# Patient Record
Sex: Female | Born: 1985 | Race: Black or African American | Hispanic: No | Marital: Single | Smoking: Never smoker
Health system: Southern US, Community
[De-identification: ages and names within clinical notes are randomized; demographics above are authoritative.]

## PROBLEM LIST (undated history)

## (undated) DIAGNOSIS — K810 Acute cholecystitis: Secondary | ICD-10-CM

## (undated) DIAGNOSIS — D649 Anemia, unspecified: Secondary | ICD-10-CM

## (undated) DIAGNOSIS — L409 Psoriasis, unspecified: Secondary | ICD-10-CM

## (undated) HISTORY — PX: NO PAST SURGERIES: SHX2092

## (undated) HISTORY — DX: Anemia, unspecified: D64.9

---
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
## 2014-06-21 ENCOUNTER — Ambulatory Visit: Payer: Self-pay

## 2014-07-04 ENCOUNTER — Ambulatory Visit: Payer: Self-pay

## 2018-12-17 ENCOUNTER — Inpatient Hospital Stay: Payer: BC Managed Care – PPO

## 2018-12-17 ENCOUNTER — Emergency Department: Payer: BC Managed Care – PPO

## 2018-12-17 ENCOUNTER — Other Ambulatory Visit: Payer: Self-pay

## 2018-12-17 ENCOUNTER — Observation Stay
Admission: EM | Admit: 2018-12-17 | Discharge: 2018-12-19 | DRG: 418 | Disposition: A | Discharge status: Home / Self Care | Payer: BC Managed Care – PPO | Attending: General Surgery | Admitting: General Surgery

## 2018-12-17 ENCOUNTER — Encounter: Payer: Self-pay | Admitting: Emergency Medicine

## 2018-12-17 DIAGNOSIS — K810 Acute cholecystitis: Secondary | ICD-10-CM | POA: Diagnosis present

## 2018-12-17 DIAGNOSIS — E871 Hypo-osmolality and hyponatremia: Secondary | ICD-10-CM | POA: Diagnosis present

## 2018-12-17 DIAGNOSIS — R7301 Impaired fasting glucose: Secondary | ICD-10-CM | POA: Diagnosis not present

## 2018-12-17 DIAGNOSIS — E669 Obesity, unspecified: Secondary | ICD-10-CM | POA: Diagnosis present

## 2018-12-17 DIAGNOSIS — K8000 Calculus of gallbladder with acute cholecystitis without obstruction: Secondary | ICD-10-CM | POA: Diagnosis not present

## 2018-12-17 DIAGNOSIS — Z6834 Body mass index (BMI) 34.0-34.9, adult: Secondary | ICD-10-CM

## 2018-12-17 DIAGNOSIS — R1011 Right upper quadrant pain: Secondary | ICD-10-CM | POA: Diagnosis present

## 2018-12-17 HISTORY — DX: Psoriasis, unspecified: L40.9

## 2018-12-17 HISTORY — DX: Acute cholecystitis: K81.0

## 2018-12-17 LAB — CBC WITH DIFFERENTIAL/PLATELET
ABS IMMATURE GRANULOCYTES: 0.01 10*3/uL (ref 0.00–0.07)
Basophils Absolute: 0 10*3/uL (ref 0.0–0.1)
Basophils Relative: 0 %
Eosinophils Absolute: 0 10*3/uL (ref 0.0–0.5)
Eosinophils Relative: 0 %
HCT: 39.6 % (ref 36.0–46.0)
Hemoglobin: 12.7 g/dL (ref 12.0–15.0)
Immature Granulocytes: 0 %
Lymphocytes Relative: 15 %
Lymphs Abs: 1.2 10*3/uL (ref 0.7–4.0)
MCH: 27 pg (ref 26.0–34.0)
MCHC: 32.1 g/dL (ref 30.0–36.0)
MCV: 84.3 fL (ref 80.0–100.0)
MONO ABS: 0.2 10*3/uL (ref 0.1–1.0)
Monocytes Relative: 2 %
NEUTROS ABS: 7 10*3/uL (ref 1.7–7.7)
Neutrophils Relative %: 83 %
Platelets: 331 10*3/uL (ref 150–400)
RBC: 4.7 MIL/uL (ref 3.87–5.11)
RDW: 14.2 % (ref 11.5–15.5)
WBC: 8.4 10*3/uL (ref 4.0–10.5)
nRBC: 0 % (ref 0.0–0.2)

## 2018-12-17 LAB — COMPREHENSIVE METABOLIC PANEL
ALT: 25 U/L (ref 0–44)
AST: 26 U/L (ref 15–41)
Albumin: 4.1 g/dL (ref 3.5–5.0)
Alkaline Phosphatase: 65 U/L (ref 38–126)
Anion gap: 8 (ref 5–15)
BUN: 10 mg/dL (ref 6–20)
CO2: 21 mmol/L — ABNORMAL LOW (ref 22–32)
Calcium: 8.7 mg/dL — ABNORMAL LOW (ref 8.9–10.3)
Chloride: 103 mmol/L (ref 98–111)
Creatinine, Ser: 0.86 mg/dL (ref 0.44–1.00)
GFR calc non Af Amer: >60 mL/min (ref >60)
Glucose, Bld: 136 mg/dL — ABNORMAL HIGH (ref 70–99)
Potassium: 4.1 mmol/L (ref 3.5–5.1)
Sodium: 132 mmol/L — ABNORMAL LOW (ref 135–145)
Total Bilirubin: 0.4 mg/dL (ref 0.3–1.2)
Total Protein: 8.1 g/dL (ref 6.5–8.1)

## 2018-12-17 LAB — URINALYSIS, COMPLETE (UACMP) WITH MICROSCOPIC
Bilirubin Urine: NEGATIVE
Glucose, UA: NEGATIVE mg/dL
Hgb urine dipstick: NEGATIVE
Ketones, ur: 5 mg/dL — AB
Leukocytes, UA: NEGATIVE
Nitrite: NEGATIVE
Protein, ur: NEGATIVE mg/dL
Specific Gravity, Urine: 1.016 (ref 1.005–1.030)
pH: 9 — ABNORMAL HIGH (ref 5.0–8.0)

## 2018-12-17 LAB — POCT PREGNANCY, URINE: Preg Test, Ur: NEGATIVE

## 2018-12-17 LAB — I-STAT BETA HCG BLOOD, ED (NOT ORDERABLE): I-stat hCG, quantitative: <5 m[IU]/mL (ref <5)

## 2018-12-17 LAB — LIPASE, BLOOD: LIPASE: 27 U/L (ref 11–51)

## 2018-12-17 MED ORDER — ACETAMINOPHEN 650 MG RE SUPP: 650.0000 mg | Freq: Four times a day (QID) | RECTAL | Status: DC | PRN

## 2018-12-17 MED ORDER — ONDANSETRON HCL 4 MG PO TABS: 4.0000 mg | ORAL_TABLET | Freq: Four times a day (QID) | ORAL | Status: DC | PRN

## 2018-12-17 MED ORDER — ONDANSETRON HCL 4 MG/2ML IJ SOLN
4.0000 mg | Freq: Once | INTRAMUSCULAR | Status: AC
  Administered 2018-12-17: 4 mg via INTRAVENOUS
  Filled 2018-12-17: qty 2

## 2018-12-17 MED ORDER — SODIUM CHLORIDE 0.9 % IV BOLUS
1000.0000 mL | Freq: Once | INTRAVENOUS | Status: AC
  Administered 2018-12-17: 1000 mL via INTRAVENOUS

## 2018-12-17 MED ORDER — ONDANSETRON HCL 4 MG/2ML IJ SOLN
4.0000 mg | Freq: Four times a day (QID) | INTRAMUSCULAR | Status: DC | PRN
  Administered 2018-12-17: 4 mg via INTRAVENOUS
  Filled 2018-12-17: qty 2

## 2018-12-17 MED ORDER — LEVONORGEST-ETH ESTRAD 91-DAY 0.15-0.03 MG PO TABS
1.0000 | ORAL_TABLET | Freq: Every day | ORAL | Status: DC
  Administered 2018-12-18: 1 via ORAL
  Filled 2018-12-17: qty 1

## 2018-12-17 MED ORDER — HYDROMORPHONE HCL 1 MG/ML IJ SOLN
1.0000 mg | Freq: Once | INTRAMUSCULAR | Status: AC
  Administered 2018-12-17: 1 mg via INTRAVENOUS
  Filled 2018-12-17: qty 1

## 2018-12-17 MED ORDER — FENTANYL CITRATE (PF) 100 MCG/2ML IJ SOLN
50.0000 ug | Freq: Once | INTRAMUSCULAR | Status: AC
  Administered 2018-12-17: 50 ug via INTRAVENOUS
  Filled 2018-12-17: qty 2

## 2018-12-17 MED ORDER — SODIUM CHLORIDE 0.9 % IV SOLN
INTRAVENOUS | Status: DC
  Administered 2018-12-17 – 2018-12-19 (×3): via INTRAVENOUS

## 2018-12-17 MED ORDER — OXYCODONE HCL 5 MG PO TABS: 5.0000 mg | ORAL_TABLET | ORAL | Status: DC | PRN

## 2018-12-17 MED ORDER — GADOBUTROL 1 MMOL/ML IV SOLN
7.0000 mL | Freq: Once | INTRAVENOUS | Status: AC | PRN
  Administered 2018-12-17: 7 mL via INTRAVENOUS

## 2018-12-17 MED ORDER — LORAZEPAM 2 MG/ML IJ SOLN: 0.5000 mg | Freq: Once | INTRAMUSCULAR | Status: DC | PRN

## 2018-12-17 MED ORDER — PIPERACILLIN-TAZOBACTAM 3.375 G IVPB
3.3750 g | Freq: Three times a day (TID) | INTRAVENOUS | Status: DC
  Administered 2018-12-17 – 2018-12-19 (×6): 3.375 g via INTRAVENOUS
  Filled 2018-12-17 (×9): qty 50

## 2018-12-17 MED ORDER — MORPHINE SULFATE (PF) 2 MG/ML IV SOLN
2.0000 mg | INTRAVENOUS | Status: DC | PRN
  Administered 2018-12-17 (×2): 2 mg via INTRAVENOUS
  Filled 2018-12-17 (×2): qty 1

## 2018-12-17 MED ORDER — KETOROLAC TROMETHAMINE 30 MG/ML IJ SOLN
30.0000 mg | Freq: Four times a day (QID) | INTRAMUSCULAR | Status: DC
  Administered 2018-12-17 – 2018-12-19 (×6): 30 mg via INTRAVENOUS
  Filled 2018-12-17 (×6): qty 1

## 2018-12-17 MED ORDER — HYDROMORPHONE HCL 1 MG/ML IJ SOLN
0.5000 mg | INTRAMUSCULAR | Status: DC | PRN
  Administered 2018-12-17 – 2018-12-18 (×4): 0.5 mg via INTRAVENOUS
  Filled 2018-12-17 (×5): qty 1

## 2018-12-17 MED ORDER — ACETAMINOPHEN 325 MG PO TABS: 650.0000 mg | ORAL_TABLET | Freq: Four times a day (QID) | ORAL | Status: DC | PRN

## 2018-12-17 NOTE — H&P (Addendum)
[facility] [place] [facility]   PATIENT NAME: Holly PoseyCandace Fisher    MR#:  [id number]  DATE OF BIRTH:  07/12/1986  DATE OF ADMISSION:  12/17/2018  PRIMARY CARE PHYSICIAN: Juliann ParesPowell, Michele, DO   REQUESTING/REFERRING PHYSICIAN: Dr Ileana RoupJames McShane  CHIEF COMPLAINT:   Chief Complaint  Patient presents with  . Abdominal Pain  . Emesis    HISTORY OF PRESENT ILLNESS:  Holly MastersCandace Fisher  is a 32 y.o. female presents with 2 days of abdominal pain in the right upper quadrant.  She feels like a severe kicking type pain 10 out of 10 in intensity.  The pain is constant.  Worse after vomiting and palpating.  Better after IV pain medication in the emergency room.  Patient denies any fever.  Having some chills and sweats.  Patient states that she is vomited 5 times and also feels nauseous.  No blood in the vomit.  No diarrhea.  In the ER, she was found to have a stone in the gallbladder neck and increased size of the common bile duct.  Hospitalist services were contacted for further evaluation.  PAST MEDICAL HISTORY:   Past Medical History:  Diagnosis Date  . Psoriasis     PAST SURGICAL HISTORY:   Past Surgical History:  Procedure Laterality Date  . NO PAST SURGERIES      SOCIAL HISTORY:   Social History   Tobacco Use  . Smoking status: Never Smoker  . Smokeless tobacco: Never Used  Substance Use Topics  . Alcohol use: Not Currently    FAMILY HISTORY:   Family History  Problem Relation Age of Onset  . Autoimmune disease Mother   . Hypertension Mother   . Healthy Father     DRUG ALLERGIES:  No Known Allergies  REVIEW OF SYSTEMS:  CONSTITUTIONAL: No fever.  Positive for chills and sweats.  Positive for fatigue.  EYES: No blurred or double vision.  EARS, NOSE, AND THROAT: No tinnitus or ear pain.  Positive for runny nose and sore throat.  Positive for dysphasia to liquids and solids. RESPIRATORY: No cough, shortness of breath. CARDIOVASCULAR: No  chest pain.  GASTROINTESTINAL: Positive for nausea, vomiting and abdominal pain. No blood in bowel movements.  Slight constipation  GENITOURINARY: No dysuria, hematuria.  ENDOCRINE: No polyuria, nocturia,  HEMATOLOGY: No anemia, easy bruising or bleeding SKIN: No rash or lesion. MUSCULOSKELETAL: Some tightness of the joints NEUROLOGIC: No tingling, numbness, weakness.  PSYCHIATRY: No anxiety or depression.   MEDICATIONS AT HOME:   Prior to Admission medications   Medication Sig Start Date End Date Taking? Authorizing Provider  Adalimumab 40 MG/0.4ML PNKT Inject 40 mg into the skin. Every other week 07/05/18  Yes [provider]  levonorgestrel-ethinyl estradiol (JOLESSA) 0.15-0.03 MG tablet Take 1 tablet by mouth daily. 12/23/17  Yes [provider]      VITAL SIGNS:  Blood pressure 111/71, pulse 98, temperature 98 F (36.7 C), resp. rate 16, height 4\' 11"  (1.499 m), weight 77.1 kg, SpO2 99 %.  PHYSICAL EXAMINATION:  GENERAL:  32 y.o.-year-old patient lying in the bed with no acute distress.  EYES: Pupils equal, round, reactive to light and accommodation. No scleral icterus. Extraocular muscles intact.  HEENT: Head atraumatic, normocephalic. Oropharynx and nasopharynx clear.  NECK:  Supple, no jugular venous distention. No thyroid enlargement, no tenderness.  LUNGS: Normal breath sounds bilaterally, no wheezing, rales,rhonchi or crepitation. No use of accessory muscles of respiration.  CARDIOVASCULAR: S1, S2 normal. No murmurs, rubs, or gallops.  ABDOMEN: Soft, right upper quadrant tenderness, nondistended. Bowel sounds present. No organomegaly or mass.  EXTREMITIES: No pedal edema, cyanosis, or clubbing.  NEUROLOGIC: Cranial nerves II through XII are intact. Muscle strength 5/5 in all extremities. Sensation intact. Gait not checked.  PSYCHIATRIC: The patient is alert and oriented x 3.  SKIN: No rash, lesion, or ulcer.   LABORATORY PANEL:   CBC Recent Labs  Lab  12/17/18 0822  WBC 8.4  HGB 12.7  HCT 39.6  PLT 331   ------------------------------------------------------------------------------------------------------------------  Chemistries  Recent Labs  Lab 12/17/18 0822  NA 132*  K 4.1  CL 103  CO2 21*  GLUCOSE 136*  BUN 10  CREATININE 0.86  CALCIUM 8.7*  AST 26  ALT 25  ALKPHOS 65  BILITOT 0.4   ------------------------------------------------------------------------------------------------------------------   RADIOLOGY:  [place] Abdomen Limited Ruq  Result Date: 12/17/2018 CLINICAL DATA:  Right upper quadrant pain EXAM: ULTRASOUND ABDOMEN LIMITED RIGHT UPPER QUADRANT COMPARISON:  None. FINDINGS: Gallbladder: Multiple gallstones, including stone fixed at the gallbladder neck. The gallbladder is full but there is no wall thickening. Focal gallbladder tenderness per sonographer exam Common bile duct: Diameter: 12 mm Liver: No focal lesion identified. Within normal limits in parenchymal echogenicity. Portal vein is patent on color Doppler imaging with normal direction of blood flow towards the liver. IMPRESSION: 1. Cholelithiasis including stone fixed in the gallbladder neck. 2. Gallbladder is focally tender and may be obstructed. There is no wall thickening or edema typical of acute cholecystitis. 3. Dilated common bile duct (12 mm) without visible choledocholithiasis. Electronically Signed   By: Marnee SpringJonathon  Watts M.D.   On: 12/17/2018 09:34    IMPRESSION AND PLAN:   1.  Right upper quadrant abdominal pain with cholelithiasis and a stone fixed in the gallbladder neck.  Concerning for choledocholithiasis.  Get an MRCP.  N.p.o. except sips with medications.  Obtain gastrointestinal and surgical consultations.  Empiric antibiotic.  As needed nausea and pain medications.  IV fluid hydration. 2.  Impaired fasting glucose check a hemoglobin A1c 3.  Obesity.  BMI 34.34.  Weight loss needed 4.  Slight hyponatremia.  IV fluid hydration.  All the  records are reviewed and case discussed with ED provider. Management plans discussed with the patient, family and they are in agreement.  CODE STATUS: Full Code  TOTAL TIME TAKING CARE OF THIS PATIENT: 45 minutes.    Alford Highlandichard Abbie Berling M.D on 12/17/2018 at 10:31 AM  Between 7am to 6pm - Pager - [phone]  After 6pm call admission pager [phone]  [facility] Office  [phone]  CC: Primary care physician; Juliann ParesPowell, Michele, DO

## 2018-12-17 NOTE — Progress Notes (Signed)
Patient off floor to MRI. Wheeled in bed by transporter.

## 2018-12-17 NOTE — Consult Note (Signed)
Date of Consultation:  12/17/2018  Requesting Physician:  Alford Highlandichard Wieting, MD  Reason for Consultation:  Acute cholecystitis  History of Present Illness: Holly Fisher is a 32 y.o. female who presented this morning to the emergency room with right upper quadrant abdominal pain associated with nausea and vomiting.  The patient reports that her pain started 2 days ago and is described as severe.  She has had episodes of emesis and currently still feels nauseous.  She presents emergency room this morning.  Her work-up included laboratory studies which were were overall normal with a white blood cell count of 8.4 and normal LFTs.  Her ultrasound showed cholelithiasis with stone in the gallbladder neck without wall thickening but the gallbladder is distended.  However her common bile duct appeared to be 12 mm in size.  An MRCP was done today this revealed cholelithiasis with a trace amount of pericholecystic fluid and gallbladder distention but no choledocholithiasis.  Surgery was consulted for further evaluation and possible cholecystectomy.  Of note, the patient reports that she has never had any episodes of pain like this before and not even any discomfort after eating.  Past Medical History: Past Medical History:  Diagnosis Date  . Acute cholecystitis   . Psoriasis      Past Surgical History: Past Surgical History:  Procedure Laterality Date  . NO PAST SURGERIES      Home Medications: Prior to Admission medications   Medication Sig Start Date End Date Taking? Authorizing Provider  Adalimumab 40 MG/0.4ML PNKT Inject 40 mg into the skin. Every other week 07/05/18  Yes [provider]  levonorgestrel-ethinyl estradiol (JOLESSA) 0.15-0.03 MG tablet Take 1 tablet by mouth daily. 12/23/17  Yes [provider]    Allergies: No Known Allergies  Social History:  reports that she has never smoked. She has never used smokeless tobacco. She reports previous alcohol use. She  reports previous drug use.   Family History: Family History  Problem Relation Age of Onset  . Autoimmune disease Mother   . Hypertension Mother   . Healthy Father     Review of Systems: Review of Systems  Constitutional: Negative for chills and fever.  HENT: Negative for hearing loss.   Respiratory: Negative for shortness of breath.   Cardiovascular: Negative for chest pain.  Gastrointestinal: Positive for abdominal pain, nausea and vomiting. Negative for constipation and diarrhea.  Genitourinary: Negative for dysuria.  Musculoskeletal: Negative for myalgias.  Skin: Negative for rash.  Neurological: Negative for dizziness.  Psychiatric/Behavioral: Negative for depression.    Physical Exam BP (!) 146/77 (BP Location: Left Arm)   Pulse 84   Temp 98.7 F (37.1 C) (Oral)   Resp 20   Ht 4\' 11"  (1.499 m)   Wt 77.1 kg   SpO2 99%   BMI 34.34 kg/m  CONSTITUTIONAL: No acute distress HEENT:  Normocephalic, atraumatic, extraocular motion intact. NECK: Trachea is midline, and there is no jugular venous distension. RESPIRATORY:  Lungs are clear, and breath sounds are equal bilaterally. Normal respiratory effort without pathologic use of accessory muscles. CARDIOVASCULAR: Heart is regular without murmurs, gallops, or rubs. GI: The abdomen is soft, nondistended, with tenderness to palpation in the right upper quadrant.  Patient has a positive Murphy's sign.  MUSCULOSKELETAL:  Normal muscle strength and tone in all four extremities.  No peripheral edema or cyanosis. SKIN: Skin turgor is normal. There are no pathologic skin lesions.  NEUROLOGIC:  Motor and sensation is grossly normal.  Cranial nerves are grossly  intact. PSYCH:  Alert and oriented to person, place and time. Affect is normal.  Laboratory Analysis: Results for orders placed or performed during the hospital encounter of 12/17/18 (from the past 24 hour(s))  Urinalysis, Complete w Microscopic     Status: Abnormal    Collection Time: 12/17/18  8:22 AM  Result Value Ref Range   Color, Urine YELLOW (A) YELLOW   APPearance CLOUDY (A) CLEAR   Specific Gravity, Urine 1.016 1.005 - 1.030   pH 9.0 (H) 5.0 - 8.0   Glucose, UA NEGATIVE NEGATIVE mg/dL   Hgb urine dipstick NEGATIVE NEGATIVE   Bilirubin Urine NEGATIVE NEGATIVE   Ketones, ur 5 (A) NEGATIVE mg/dL   Protein, ur NEGATIVE NEGATIVE mg/dL   Nitrite NEGATIVE NEGATIVE   Leukocytes, UA NEGATIVE NEGATIVE   RBC / HPF 0-5 0 - 5 RBC/hpf   WBC, UA 0-5 0 - 5 WBC/hpf   Bacteria, UA RARE (A) NONE SEEN   Squamous Epithelial / LPF 0-5 0 - 5   Mucus PRESENT   CBC with Differential     Status: None   Collection Time: 12/17/18  8:22 AM  Result Value Ref Range   WBC 8.4 4.0 - 10.5 K/uL   RBC 4.70 3.87 - 5.11 MIL/uL   Hemoglobin 12.7 12.0 - 15.0 g/dL   HCT 16.1 09.6 - 04.5 %   MCV 84.3 80.0 - 100.0 fL   MCH 27.0 26.0 - 34.0 pg   MCHC 32.1 30.0 - 36.0 g/dL   RDW 40.9 81.1 - 91.4 %   Platelets 331 150 - 400 K/uL   nRBC 0.0 0.0 - 0.2 %   Neutrophils Relative % 83 %   Neutro Abs 7.0 1.7 - 7.7 K/uL   Lymphocytes Relative 15 %   Lymphs Abs 1.2 0.7 - 4.0 K/uL   Monocytes Relative 2 %   Monocytes Absolute 0.2 0.1 - 1.0 K/uL   Eosinophils Relative 0 %   Eosinophils Absolute 0.0 0.0 - 0.5 K/uL   Basophils Relative 0 %   Basophils Absolute 0.0 0.0 - 0.1 K/uL   Immature Granulocytes 0 %   Abs Immature Granulocytes 0.01 0.00 - 0.07 K/uL  Comprehensive metabolic panel     Status: Abnormal   Collection Time: 12/17/18  8:22 AM  Result Value Ref Range   Sodium 132 (L) 135 - 145 mmol/L   Potassium 4.1 3.5 - 5.1 mmol/L   Chloride 103 98 - 111 mmol/L   CO2 21 (L) 22 - 32 mmol/L   Glucose, Bld 136 (H) 70 - 99 mg/dL   BUN 10 6 - 20 mg/dL   Creatinine, Ser 7.82 0.44 - 1.00 mg/dL   Calcium 8.7 (L) 8.9 - 10.3 mg/dL   Total Protein 8.1 6.5 - 8.1 g/dL   Albumin 4.1 3.5 - 5.0 g/dL   AST 26 15 - 41 U/L   ALT 25 0 - 44 U/L   Alkaline Phosphatase 65 38 - 126 U/L   Total  Bilirubin 0.4 0.3 - 1.2 mg/dL   GFR calc non Af Amer >60 >60 mL/min   GFR calc Af Amer >60 >60 mL/min   Anion gap 8 5 - 15  Lipase, blood     Status: None   Collection Time: 12/17/18  8:22 AM  Result Value Ref Range   Lipase 27 11 - 51 U/L  I-Stat beta hCG blood, ED     Status: None   Collection Time: 12/17/18  8:26 AM  Result Value Ref Range  I-stat hCG, quantitative <5.0 <5 mIU/mL   Comment 3          Pregnancy, urine POC     Status: None   Collection Time: 12/17/18  8:31 AM  Result Value Ref Range   Preg Test, Ur NEGATIVE NEGATIVE    Imaging: Mr 3d Recon At Scanner  Result Date: 12/17/2018 CLINICAL DATA:  32 year old female with history of right upper quadrant abdominal pain. No fever or elevated white blood count. Gallstones noted on recent ultrasound examination. EXAM: MRI ABDOMEN WITHOUT AND WITH CONTRAST (INCLUDING MRCP) TECHNIQUE: Multiplanar multisequence MR imaging of the abdomen was performed both before and after the administration of intravenous contrast. Heavily T2-weighted images of the biliary and pancreatic ducts were obtained, and three-dimensional MRCP images were rendered by post processing. CONTRAST:  7 mL of Gadavist. COMPARISON:  No prior abdominal MRI. Abdominal ultrasound 12/17/2018. FINDINGS: Lower chest: Unremarkable. Hepatobiliary: In segment 4A of the liver there is a subtle 12 mm lesion which is T1 isointense, mildly T2 hyperintense and demonstrates arterial phase hyperenhancement with persistent enhancement on delayed phase images. No other suspicious hepatic lesions are noted. No intra or extrahepatic biliary ductal dilatation noted on MRCP images. Common bile duct measures 6 mm in the porta hepatis. Numerous filling defects are noted within the gallbladder, compatible with gallstones, including gallstones measuring up to 12 mm in the gallbladder neck. Gallbladder is only moderately distended. No gallbladder wall thickening. Trace amount of pericholecystic  fluid. Pancreas: No pancreatic mass. No pancreatic ductal dilatation. No pancreatic or peripancreatic fluid or inflammatory changes. Spleen:  Unremarkable. Adrenals/Urinary Tract: Bilateral kidneys and bilateral adrenal glands are normal in appearance. No hydroureteronephrosis in the visualized portions of the abdomen. Stomach/Bowel: Visualized portions are unremarkable. Vascular/Lymphatic: No significant atherosclerotic disease or definite aneurysm identified in the visualized abdominal vasculature. No lymphadenopathy noted in the abdomen. Other: No significant volume of ascites noted in the visualized portions of the abdomen. Musculoskeletal: No aggressive appearing osseous lesions are noted in the visualized portions of the skeleton. IMPRESSION: 1. Cholelithiasis with a trace amount of pericholecystic fluid. Gallbladder is only moderately distended at this time. Findings are considered equivocal for acute cholecystitis. 2. No choledocholithiasis or evidence to suggest biliary tract obstruction at this time. 3. 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 This could be definitively characterized with follow-up MRI of the abdomen with and without IV gadolinium (Eovist) if of clinical concern. Electronically Signed   By: Trudie Reed M.D.   On: 12/17/2018 15:47   Mr Abdomen Mrcp Vivien Rossetti Contast  Result Date: 12/17/2018 CLINICAL DATA:  32 year old female with history of right upper quadrant abdominal pain. No fever or elevated white blood count. Gallstones noted on recent ultrasound examination. EXAM: MRI ABDOMEN WITHOUT AND WITH CONTRAST (INCLUDING MRCP) TECHNIQUE: Multiplanar multisequence MR imaging of the abdomen was performed both before and after the administration of intravenous contrast. Heavily T2-weighted images of the biliary and pancreatic ducts were obtained,  and three-dimensional MRCP images were rendered by post processing. CONTRAST:  7 mL of Gadavist. COMPARISON:  No prior abdominal MRI. Abdominal ultrasound 12/17/2018. FINDINGS: Lower chest: Unremarkable. Hepatobiliary: In segment 4A of the liver there is a subtle 12 mm lesion which is T1 isointense, mildly T2 hyperintense and demonstrates arterial phase hyperenhancement with persistent enhancement on delayed phase images. No other suspicious hepatic lesions are noted. No intra or extrahepatic biliary ductal  dilatation noted on MRCP images. Common bile duct measures 6 mm in the porta hepatis. Numerous filling defects are noted within the gallbladder, compatible with gallstones, including gallstones measuring up to 12 mm in the gallbladder neck. Gallbladder is only moderately distended. No gallbladder wall thickening. Trace amount of pericholecystic fluid. Pancreas: No pancreatic mass. No pancreatic ductal dilatation. No pancreatic or peripancreatic fluid or inflammatory changes. Spleen:  Unremarkable. Adrenals/Urinary Tract: Bilateral kidneys and bilateral adrenal glands are normal in appearance. No hydroureteronephrosis in the visualized portions of the abdomen. Stomach/Bowel: Visualized portions are unremarkable. Vascular/Lymphatic: No significant atherosclerotic disease or definite aneurysm identified in the visualized abdominal vasculature. No lymphadenopathy noted in the abdomen. Other: No significant volume of ascites noted in the visualized portions of the abdomen. Musculoskeletal: No aggressive appearing osseous lesions are noted in the visualized portions of the skeleton. IMPRESSION: 1. Cholelithiasis with a trace amount of pericholecystic fluid. Gallbladder is only moderately distended at this time. Findings are considered equivocal for acute cholecystitis. 2. No choledocholithiasis or evidence to suggest biliary tract obstruction at this time. 3. 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 This could be definitively characterized with follow-up MRI of the abdomen with and without IV gadolinium (Eovist) if of clinical concern. Electronically Signed   By: Trudie Reed M.D.   On: 12/17/2018 15:47   [place] Abdomen Limited Ruq  Result Date: 12/17/2018 CLINICAL DATA:  Right upper quadrant pain EXAM: ULTRASOUND ABDOMEN LIMITED RIGHT UPPER QUADRANT COMPARISON:  None. FINDINGS: Gallbladder: Multiple gallstones, including stone fixed at the gallbladder neck. The gallbladder is full but there is no wall thickening. Focal gallbladder tenderness per sonographer exam Common bile duct: Diameter: 12 mm Liver: No focal lesion identified. Within normal limits in parenchymal echogenicity. Portal vein is patent on color Doppler imaging with normal direction of blood flow towards the liver. IMPRESSION: 1. Cholelithiasis including stone fixed in the gallbladder neck. 2. Gallbladder is focally tender and may be obstructed. There is no wall thickening or edema typical of acute cholecystitis. 3. Dilated common bile duct (12 mm) without visible choledocholithiasis. Electronically Signed   By: Marnee Spring M.D.   On: 12/17/2018 09:34    Assessment and Plan: This is a 32 y.o. female with acute cholecystitis.  I have independently viewed the patient's imaging studies and reviewed the patient's laboratory studies.  Overall her ultrasound does show distended gallbladder with no wall thickening and with cholelithiasis with a stone stuck at the neck of the gallbladder.  Her common bile duct does appear dilated but I do not see any filling defects.  MRCP confirms this with no choledocholithiasis but there is concern for cholecystitis given the pericholecystic fluid and gallbladder distention and positive Murphy's sign.  Discussed with the patient that given her findings my concern is  that she does have acute cholecystitis.  There is no choledocholithiasis and her LFTs are normal.  However did discuss with the patient that the standard of care for cholecystitis includes cholecystectomy.  She was started on IV antibiotics as well as IV fluid hydration.  She will be kept n.p.o. given her persistent nausea right now.  She still reports right upper quadrant pain and we will change her pain medications to include Toradol and Dilaudid to help better with pain control.  Discussed with the patient that we will plan on taking her to the operating room tomorrow morning for  laparoscopic cholecystectomy.  Unfortunately currently there is no or availability tonight.  Discussed with her the risks of bleeding, infection, injury to surrounding structures.  Discussed with her also that I would not be at the hospital tomorrow and instead Dr. Hazle Quantintron-Diaz has graciously agreed to take her to the operating room tomorrow.  Patient is in agreement with this plan and all of her questions have been answered.  Face-to-face time spent with the patient and care providers was 80 minutes, with more than 50% of the time spent counseling, educating, and coordinating care of the patient.     Howie IllJose Luis Alyviah Crandle, MD [facility] Pg:  [phone]

## 2018-12-17 NOTE — H&P (View-Only) (Signed)
Date of Consultation:  12/17/2018  Requesting Physician:  Richard Wieting, MD  Reason for Consultation:  Acute cholecystitis  History of Present Illness: Holly Fisher is a 32 y.o. female who presented this morning to the emergency room with right upper quadrant abdominal pain associated with nausea and vomiting.  The patient reports that her pain started 2 days ago and is described as severe.  She has had episodes of emesis and currently still feels nauseous.  She presents emergency room this morning.  Her work-up included laboratory studies which were were overall normal with a white blood cell count of 8.4 and normal LFTs.  Her ultrasound showed cholelithiasis with stone in the gallbladder neck without wall thickening but the gallbladder is distended.  However her common bile duct appeared to be 12 mm in size.  An MRCP was done today this revealed cholelithiasis with a trace amount of pericholecystic fluid and gallbladder distention but no choledocholithiasis.  Surgery was consulted for further evaluation and possible cholecystectomy.  Of note, the patient reports that she has never had any episodes of pain like this before and not even any discomfort after eating.  Past Medical History: Past Medical History:  Diagnosis Date  . Acute cholecystitis   . Psoriasis      Past Surgical History: Past Surgical History:  Procedure Laterality Date  . NO PAST SURGERIES      Home Medications: Prior to Admission medications   Medication Sig Start Date End Date Taking? Authorizing Provider  Adalimumab 40 MG/0.4ML PNKT Inject 40 mg into the skin. Every other week 07/05/18  Yes [provider]  levonorgestrel-ethinyl estradiol (JOLESSA) 0.15-0.03 MG tablet Take 1 tablet by mouth daily. 12/23/17  Yes [provider]    Allergies: No Known Allergies  Social History:  reports that she has never smoked. She has never used smokeless tobacco. She reports previous alcohol use. She  reports previous drug use.   Family History: Family History  Problem Relation Age of Onset  . Autoimmune disease Mother   . Hypertension Mother   . Healthy Father     Review of Systems: Review of Systems  Constitutional: Negative for chills and fever.  HENT: Negative for hearing loss.   Respiratory: Negative for shortness of breath.   Cardiovascular: Negative for chest pain.  Gastrointestinal: Positive for abdominal pain, nausea and vomiting. Negative for constipation and diarrhea.  Genitourinary: Negative for dysuria.  Musculoskeletal: Negative for myalgias.  Skin: Negative for rash.  Neurological: Negative for dizziness.  Psychiatric/Behavioral: Negative for depression.    Physical Exam BP (!) 146/77 (BP Location: Left Arm)   Pulse 84   Temp 98.7 F (37.1 C) (Oral)   Resp 20   Ht 4' 11" (1.499 m)   Wt 77.1 kg   SpO2 99%   BMI 34.34 kg/m  CONSTITUTIONAL: No acute distress HEENT:  Normocephalic, atraumatic, extraocular motion intact. NECK: Trachea is midline, and there is no jugular venous distension. RESPIRATORY:  Lungs are clear, and breath sounds are equal bilaterally. Normal respiratory effort without pathologic use of accessory muscles. CARDIOVASCULAR: Heart is regular without murmurs, gallops, or rubs. GI: The abdomen is soft, nondistended, with tenderness to palpation in the right upper quadrant.  Patient has a positive Murphy's sign.  MUSCULOSKELETAL:  Normal muscle strength and tone in all four extremities.  No peripheral edema or cyanosis. SKIN: Skin turgor is normal. There are no pathologic skin lesions.  NEUROLOGIC:  Motor and sensation is grossly normal.  Cranial nerves are grossly   intact. PSYCH:  Alert and oriented to person, place and time. Affect is normal.  Laboratory Analysis: Results for orders placed or performed during the hospital encounter of 12/17/18 (from the past 24 hour(s))  Urinalysis, Complete w Microscopic     Status: Abnormal    Collection Time: 12/17/18  8:22 AM  Result Value Ref Range   Color, Urine YELLOW (A) YELLOW   APPearance CLOUDY (A) CLEAR   Specific Gravity, Urine 1.016 1.005 - 1.030   pH 9.0 (H) 5.0 - 8.0   Glucose, UA NEGATIVE NEGATIVE mg/dL   Hgb urine dipstick NEGATIVE NEGATIVE   Bilirubin Urine NEGATIVE NEGATIVE   Ketones, ur 5 (A) NEGATIVE mg/dL   Protein, ur NEGATIVE NEGATIVE mg/dL   Nitrite NEGATIVE NEGATIVE   Leukocytes, UA NEGATIVE NEGATIVE   RBC / HPF 0-5 0 - 5 RBC/hpf   WBC, UA 0-5 0 - 5 WBC/hpf   Bacteria, UA RARE (A) NONE SEEN   Squamous Epithelial / LPF 0-5 0 - 5   Mucus PRESENT   CBC with Differential     Status: None   Collection Time: 12/17/18  8:22 AM  Result Value Ref Range   WBC 8.4 4.0 - 10.5 K/uL   RBC 4.70 3.87 - 5.11 MIL/uL   Hemoglobin 12.7 12.0 - 15.0 g/dL   HCT 39.6 36.0 - 46.0 %   MCV 84.3 80.0 - 100.0 fL   MCH 27.0 26.0 - 34.0 pg   MCHC 32.1 30.0 - 36.0 g/dL   RDW 14.2 11.5 - 15.5 %   Platelets 331 150 - 400 K/uL   nRBC 0.0 0.0 - 0.2 %   Neutrophils Relative % 83 %   Neutro Abs 7.0 1.7 - 7.7 K/uL   Lymphocytes Relative 15 %   Lymphs Abs 1.2 0.7 - 4.0 K/uL   Monocytes Relative 2 %   Monocytes Absolute 0.2 0.1 - 1.0 K/uL   Eosinophils Relative 0 %   Eosinophils Absolute 0.0 0.0 - 0.5 K/uL   Basophils Relative 0 %   Basophils Absolute 0.0 0.0 - 0.1 K/uL   Immature Granulocytes 0 %   Abs Immature Granulocytes 0.01 0.00 - 0.07 K/uL  Comprehensive metabolic panel     Status: Abnormal   Collection Time: 12/17/18  8:22 AM  Result Value Ref Range   Sodium 132 (L) 135 - 145 mmol/L   Potassium 4.1 3.5 - 5.1 mmol/L   Chloride 103 98 - 111 mmol/L   CO2 21 (L) 22 - 32 mmol/L   Glucose, Bld 136 (H) 70 - 99 mg/dL   BUN 10 6 - 20 mg/dL   Creatinine, Ser 0.86 0.44 - 1.00 mg/dL   Calcium 8.7 (L) 8.9 - 10.3 mg/dL   Total Protein 8.1 6.5 - 8.1 g/dL   Albumin 4.1 3.5 - 5.0 g/dL   AST 26 15 - 41 U/L   ALT 25 0 - 44 U/L   Alkaline Phosphatase 65 38 - 126 U/L   Total  Bilirubin 0.4 0.3 - 1.2 mg/dL   GFR calc non Af Amer >60 >60 mL/min   GFR calc Af Amer >60 >60 mL/min   Anion gap 8 5 - 15  Lipase, blood     Status: None   Collection Time: 12/17/18  8:22 AM  Result Value Ref Range   Lipase 27 11 - 51 U/L  I-Stat beta hCG blood, ED     Status: None   Collection Time: 12/17/18  8:26 AM  Result Value Ref Range     I-stat hCG, quantitative <5.0 <5 mIU/mL   Comment 3          Pregnancy, urine POC     Status: None   Collection Time: 12/17/18  8:31 AM  Result Value Ref Range   Preg Test, Ur NEGATIVE NEGATIVE    Imaging: Mr 3d Recon At Scanner  Result Date: 12/17/2018 CLINICAL DATA:  32-year-old female with history of right upper quadrant abdominal pain. No fever or elevated white blood count. Gallstones noted on recent ultrasound examination. EXAM: MRI ABDOMEN WITHOUT AND WITH CONTRAST (INCLUDING MRCP) TECHNIQUE: Multiplanar multisequence MR imaging of the abdomen was performed both before and after the administration of intravenous contrast. Heavily T2-weighted images of the biliary and pancreatic ducts were obtained, and three-dimensional MRCP images were rendered by post processing. CONTRAST:  7 mL of Gadavist. COMPARISON:  No prior abdominal MRI. Abdominal ultrasound 12/17/2018. FINDINGS: Lower chest: Unremarkable. Hepatobiliary: In segment 4A of the liver there is a subtle 12 mm lesion which is T1 isointense, mildly T2 hyperintense and demonstrates arterial phase hyperenhancement with persistent enhancement on delayed phase images. No other suspicious hepatic lesions are noted. No intra or extrahepatic biliary ductal dilatation noted on MRCP images. Common bile duct measures 6 mm in the porta hepatis. Numerous filling defects are noted within the gallbladder, compatible with gallstones, including gallstones measuring up to 12 mm in the gallbladder neck. Gallbladder is only moderately distended. No gallbladder wall thickening. Trace amount of pericholecystic  fluid. Pancreas: No pancreatic mass. No pancreatic ductal dilatation. No pancreatic or peripancreatic fluid or inflammatory changes. Spleen:  Unremarkable. Adrenals/Urinary Tract: Bilateral kidneys and bilateral adrenal glands are normal in appearance. No hydroureteronephrosis in the visualized portions of the abdomen. Stomach/Bowel: Visualized portions are unremarkable. Vascular/Lymphatic: No significant atherosclerotic disease or definite aneurysm identified in the visualized abdominal vasculature. No lymphadenopathy noted in the abdomen. Other: No significant volume of ascites noted in the visualized portions of the abdomen. Musculoskeletal: No aggressive appearing osseous lesions are noted in the visualized portions of the skeleton. IMPRESSION: 1. Cholelithiasis with a trace amount of pericholecystic fluid. Gallbladder is only moderately distended at this time. Findings are considered equivocal for acute cholecystitis. 2. No choledocholithiasis or evidence to suggest biliary tract obstruction at this time. 3. 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 This could be definitively characterized with follow-up MRI of the abdomen with and without IV gadolinium (Eovist) if of clinical concern. Electronically Signed   By: Daniel  Entrikin M.D.   On: 12/17/2018 15:47   Mr Abdomen Mrcp W Wo Contast  Result Date: 12/17/2018 CLINICAL DATA:  32-year-old female with history of right upper quadrant abdominal pain. No fever or elevated white blood count. Gallstones noted on recent ultrasound examination. EXAM: MRI ABDOMEN WITHOUT AND WITH CONTRAST (INCLUDING MRCP) TECHNIQUE: Multiplanar multisequence MR imaging of the abdomen was performed both before and after the administration of intravenous contrast. Heavily T2-weighted images of the biliary and pancreatic ducts were obtained,  and three-dimensional MRCP images were rendered by post processing. CONTRAST:  7 mL of Gadavist. COMPARISON:  No prior abdominal MRI. Abdominal ultrasound 12/17/2018. FINDINGS: Lower chest: Unremarkable. Hepatobiliary: In segment 4A of the liver there is a subtle 12 mm lesion which is T1 isointense, mildly T2 hyperintense and demonstrates arterial phase hyperenhancement with persistent enhancement on delayed phase images. No other suspicious hepatic lesions are noted. No intra or extrahepatic biliary ductal   dilatation noted on MRCP images. Common bile duct measures 6 mm in the porta hepatis. Numerous filling defects are noted within the gallbladder, compatible with gallstones, including gallstones measuring up to 12 mm in the gallbladder neck. Gallbladder is only moderately distended. No gallbladder wall thickening. Trace amount of pericholecystic fluid. Pancreas: No pancreatic mass. No pancreatic ductal dilatation. No pancreatic or peripancreatic fluid or inflammatory changes. Spleen:  Unremarkable. Adrenals/Urinary Tract: Bilateral kidneys and bilateral adrenal glands are normal in appearance. No hydroureteronephrosis in the visualized portions of the abdomen. Stomach/Bowel: Visualized portions are unremarkable. Vascular/Lymphatic: No significant atherosclerotic disease or definite aneurysm identified in the visualized abdominal vasculature. No lymphadenopathy noted in the abdomen. Other: No significant volume of ascites noted in the visualized portions of the abdomen. Musculoskeletal: No aggressive appearing osseous lesions are noted in the visualized portions of the skeleton. IMPRESSION: 1. Cholelithiasis with a trace amount of pericholecystic fluid. Gallbladder is only moderately distended at this time. Findings are considered equivocal for acute cholecystitis. 2. No choledocholithiasis or evidence to suggest biliary tract obstruction at this time. 3. 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 This could be definitively characterized with follow-up MRI of the abdomen with and without IV gadolinium (Eovist) if of clinical concern. Electronically Signed   By: Daniel  Entrikin M.D.   On: 12/17/2018 15:47   Us Abdomen Limited Ruq  Result Date: 12/17/2018 CLINICAL DATA:  Right upper quadrant pain EXAM: ULTRASOUND ABDOMEN LIMITED RIGHT UPPER QUADRANT COMPARISON:  None. FINDINGS: Gallbladder: Multiple gallstones, including stone fixed at the gallbladder neck. The gallbladder is full but there is no wall thickening. Focal gallbladder tenderness per sonographer exam Common bile duct: Diameter: 12 mm Liver: No focal lesion identified. Within normal limits in parenchymal echogenicity. Portal vein is patent on color Doppler imaging with normal direction of blood flow towards the liver. IMPRESSION: 1. Cholelithiasis including stone fixed in the gallbladder neck. 2. Gallbladder is focally tender and may be obstructed. There is no wall thickening or edema typical of acute cholecystitis. 3. Dilated common bile duct (12 mm) without visible choledocholithiasis. Electronically Signed   By: Jonathon  Watts M.D.   On: 12/17/2018 09:34    Assessment and Plan: This is a 32 y.o. female with acute cholecystitis.  I have independently viewed the patient's imaging studies and reviewed the patient's laboratory studies.  Overall her ultrasound does show distended gallbladder with no wall thickening and with cholelithiasis with a stone stuck at the neck of the gallbladder.  Her common bile duct does appear dilated but I do not see any filling defects.  MRCP confirms this with no choledocholithiasis but there is concern for cholecystitis given the pericholecystic fluid and gallbladder distention and positive Murphy's sign.  Discussed with the patient that given her findings my concern is  that she does have acute cholecystitis.  There is no choledocholithiasis and her LFTs are normal.  However did discuss with the patient that the standard of care for cholecystitis includes cholecystectomy.  She was started on IV antibiotics as well as IV fluid hydration.  She will be kept n.p.o. given her persistent nausea right now.  She still reports right upper quadrant pain and we will change her pain medications to include Toradol and Dilaudid to help better with pain control.  Discussed with the patient that we will plan on taking her to the operating room tomorrow morning for   laparoscopic cholecystectomy.  Unfortunately currently there is no or availability tonight.  Discussed with her the risks of bleeding, infection, injury to surrounding structures.  Discussed with her also that I would not be at the hospital tomorrow and instead Dr. Cintron-Diaz has graciously agreed to take her to the operating room tomorrow.  Patient is in agreement with this plan and all of her questions have been answered.  Face-to-face time spent with the patient and care providers was 80 minutes, with more than 50% of the time spent counseling, educating, and coordinating care of the patient.     Selenia Mihok Luis Narissa Beaufort, MD [facility] Pg:  [phone]  

## 2018-12-17 NOTE — ED Notes (Signed)
Admitting at bedside 

## 2018-12-17 NOTE — ED Notes (Signed)
Patient transported to Ultrasound 

## 2018-12-17 NOTE — Progress Notes (Signed)
Patient transferred to floor. Patient complaining of pain 10/10 in upper epigastric area radiating to RUQ. Patient offered oxycodone but refused because patient thinks she will vomit. Morphine given for pain. Pain down to 8/10 after 15 minutes. Patient nauseous but no vomiting at this time. Bowel sounds active in all four quadrants. Patient also requesting food. RN explained NPO order with sips for meds. Patient verbalized understanding.  Pharmacy spoke with RN about not carrying patient's brand of birthcontrol that's currently ordered. Patient will need to have med brought from home and sent to pharmacy if she desires to take it. RN updated patient and significant other. Both verbalize understanding of teaching.  GI and general surgery consults ordered. RN to call those consults.

## 2018-12-17 NOTE — ED Triage Notes (Signed)
Pt to ED via POV, pt states that she has "some really bad stomach problems" right now and has been vomiting since this morning around 1230. Pt family members that she has vomited x 4 and has had fever and sweats on and off.

## 2018-12-17 NOTE — Progress Notes (Signed)
Radiology/MRI called about patient having imaging orders. MRI states they will send for patient as soon as they can. MD, Dr. Servando SnareWohl, called about GI consult. MD states he will see her after imgaing, most likely tomorrow and that he spoke with general surgery, Dr. Aleen CampiPiscoya already. RN to place consulting providers on the care team, per Dr. Servando SnareWohl.

## 2018-12-17 NOTE — Progress Notes (Signed)
Pharmacy Antibiotic Note  Alonzo L Burgess EstelleSpinks is a 32 y.o. female admitted on 12/17/2018 with IAI.  Pharmacy has been consulted for Zosyn dosing.  Plan: Zosyn 3.375 gm IV Q8H EI  Height: 4\' 11"  (149.9 cm) Weight: 170 lb (77.1 kg) IBW/kg (Calculated) : 43.2  Temp (24hrs), Avg:98 F (36.7 C), Min:98 F (36.7 C), Max:98 F (36.7 C)  Recent Labs  Lab 12/17/18 0822  WBC 8.4  CREATININE 0.86    Estimated Creatinine Clearance: 85.8 mL/min (by C-G formula based on SCr of 0.86 mg/dL).    No Known Allergies  Antimicrobials this admission:  Dose adjustments this admission:   Microbiology results:  BCx:   UCx:    Sputum:    MRSA PCR:   Thank you for allowing pharmacy to be a part of this patient's care.  Carola FrostNathan A Cullan Launer, PharmD, BCPS Clinical Pharmacist 12/17/2018 10:51 AM

## 2018-12-17 NOTE — ED Provider Notes (Addendum)
[facility] Emergency Department Provider Note  ____________________________________________   I have reviewed the triage vital signs and the nursing notes. Where available I have reviewed prior notes and, if possible and indicated, outside hospital notes.    HISTORY  Chief Complaint Abdominal Pain and Emesis    HPI Holly Fisher is a 32 y.o. female he states she is never had abdominal surgery states she began this morning with right upper quadrant abdominal pain and vomiting.  Started in the night.  No fever.  Denies pregnancy.  Dates that her periods are based on her birth control pill and she is on a 91-day cycle.  She is had no dysuria no recent vaginal bleeding, no diarrhea no melena no bright red blood per rectum.  She does have epigastric and right upper quadrant abdominal pain which is been there since around the time of her vomiting conception.  Pain is sharp nonradiating no other alleviating or aggravating factors.  She states "my whole belly hurts" but she specifies right upper quadrant as being the area of most intense discomfort.  Has not had this before.  No other alleviating or aggravating factors no antecedent intervention    History reviewed. No pertinent past medical history.  There are no active problems to display for this patient.   History reviewed. No pertinent surgical history.  Prior to Admission medications   Not on File    Allergies Patient has no known allergies.  No family history on file.  Social History Social History   Tobacco Use  . Smoking status: Never Smoker  . Smokeless tobacco: Never Used  Substance Use Topics  . Alcohol use: Not Currently  . Drug use: Not Currently    Review of Systems Constitutional: No fever/chills Eyes: No visual changes. ENT: No sore throat. No stiff neck no neck pain Cardiovascular: Denies chest pain. Respiratory: Denies shortness of breath. Gastrointestinal:   + vomiting.  No  diarrhea.  No constipation. Genitourinary: Negative for dysuria. Musculoskeletal: Negative lower extremity swelling Skin: Negative for rash. Neurological: Negative for severe headaches, focal weakness or numbness.   ____________________________________________   PHYSICAL EXAM:  VITAL SIGNS: ED Triage Vitals  Enc Vitals Group     BP 12/17/18 0753 (!) 116/49     Pulse Rate 12/17/18 0752 65     Resp 12/17/18 0752 16     Temp 12/17/18 0752 98 F (36.7 C)     Temp src --      SpO2 12/17/18 0752 100 %     Weight 12/17/18 0753 170 lb (77.1 kg)     Height 12/17/18 0753 4\' 11"  (1.499 m)     Head Circumference --      Peak Flow --      Pain Score 12/17/18 0752 10     Pain Loc --      Pain Edu? --      Excl. in GC? --     Constitutional: Alert and oriented. Well appearing and in no acute distress. Eyes: Conjunctivae are normal Head: Atraumatic HEENT: No congestion/rhinnorhea. Mucous membranes are moist.  Oropharynx non-erythematous Neck:   Nontender with no meningismus, no masses, no stridor Cardiovascular: Normal rate, regular rhythm. Grossly normal heart sounds.  Good peripheral circulation. Respiratory: Normal respiratory effort.  No retractions. Lungs CTAB. Abdominal: Soft positive focal right upper quadrant tenderness with voluntary guarding no rebound some epigastric discomfort noted no distention.  Back:  There is no focal tenderness or step off.  there is no  midline tenderness there are no lesions noted. there is no CVA tenderness Musculoskeletal: No lower extremity tenderness, no upper extremity tenderness. No joint effusions, no DVT signs strong distal pulses no edema Neurologic:  Normal speech and language. No gross focal neurologic deficits are appreciated.  Skin:  Skin is warm, dry and intact. No rash noted. Psychiatric: Mood and affect are normal. Speech and behavior are normal.  ____________________________________________   LABS (all labs ordered are listed, but  only abnormal results are displayed)  Labs Reviewed  CBC WITH DIFFERENTIAL/PLATELET  URINALYSIS, COMPLETE (UACMP) WITH MICROSCOPIC  COMPREHENSIVE METABOLIC PANEL  LIPASE, BLOOD  I-STAT BETA HCG BLOOD, ED ([facility], [facility], [facility] ONLY)  POCT PREGNANCY, URINE  I-STAT BETA HCG BLOOD, ED (NOT ORDERABLE)  CBG MONITORING, ED    Pertinent labs  results that were available during my care of the patient were reviewed by me and considered in my medical decision making (see chart for details). ____________________________________________  EKG  I personally interpreted any EKGs ordered by me or triage  ____________________________________________  RADIOLOGY  Pertinent labs & imaging results that were available during my care of the patient were reviewed by me and considered in my medical decision making (see chart for details). If possible, patient and/or family made aware of any abnormal findings.  No results found. ____________________________________________    PROCEDURES  Procedure(s) performed: None  Procedures  Critical Care performed: None  ____________________________________________   INITIAL IMPRESSION / ASSESSMENT AND PLAN / ED COURSE  Pertinent labs & imaging results that were available during my care of the patient were reviewed by me and considered in my medical decision making (see chart for details).  Patient with very reproducible gastric right upper quadrant abdominal pain principally the, there is some diffuse other discomfort but mostly is concentrated there, will give her pain control.  Antiemetics, blood work thus far is reassuring, she is not pregnant.  Electrolytes and liver function tests are reassuring.  Differential includes viral gastroenteritis with no diarrhea yet, or given that her pain seems to be also in the right upper quadrant gallbladder disease.  Appendicitis is less likely.  We will give her IV fluids obtain ultrasound and  reassess.  ----------------------------------------- 10:11 AM on 12/17/2018 -----------------------------------------  I discussed with Dr. Servando SnareWohl, who is on for GI given common bile duct dilatation he recommends admission to the hospitalist service with the hospital service coordinating care between GI and surgery.  No evidence of obstructive pathology on her blood work fortunately and no evidence of cholecystitis.  We will give her continued pain medication as needed here, she has a nonsurgical abdomen.    ____________________________________________   FINAL CLINICAL IMPRESSION(S) / ED DIAGNOSES  Final diagnoses:  RUQ abdominal pain      This chart was dictated using voice recognition software.  Despite best efforts to proofread,  errors can occur which can change meaning.      Jeanmarie PlantMcShane, James A, MD 12/17/18 [id number]    Jeanmarie PlantMcShane, James A, MD 12/17/18 1012

## 2018-12-18 ENCOUNTER — Inpatient Hospital Stay: Payer: BC Managed Care – PPO | Admitting: Anesthesiology

## 2018-12-18 ENCOUNTER — Encounter: Payer: Self-pay | Admitting: Certified Registered Nurse Anesthetist

## 2018-12-18 ENCOUNTER — Encounter
Admission: EM | Disposition: A | Discharge status: Home / Self Care | Payer: Self-pay | Source: Home / Self Care | Attending: Emergency Medicine

## 2018-12-18 DIAGNOSIS — K810 Acute cholecystitis: Secondary | ICD-10-CM

## 2018-12-18 DIAGNOSIS — R1011 Right upper quadrant pain: Secondary | ICD-10-CM | POA: Diagnosis not present

## 2018-12-18 HISTORY — PX: CHOLECYSTECTOMY: SHX55

## 2018-12-18 LAB — HEPATIC FUNCTION PANEL
ALT: 25 U/L (ref 0–44)
AST: 24 U/L (ref 15–41)
Albumin: 2.9 g/dL — ABNORMAL LOW (ref 3.5–5.0)
Alkaline Phosphatase: 49 U/L (ref 38–126)
Bilirubin, Direct: 0.1 mg/dL (ref 0.0–0.2)
Indirect Bilirubin: 0.6 mg/dL (ref 0.3–0.9)
Total Bilirubin: 0.7 mg/dL (ref 0.3–1.2)
Total Protein: 6.2 g/dL — ABNORMAL LOW (ref 6.5–8.1)

## 2018-12-18 LAB — CBC
HCT: 35.5 % — ABNORMAL LOW (ref 36.0–46.0)
Hemoglobin: 11.5 g/dL — ABNORMAL LOW (ref 12.0–15.0)
MCH: 26.7 pg (ref 26.0–34.0)
MCHC: 32.4 g/dL (ref 30.0–36.0)
MCV: 82.6 fL (ref 80.0–100.0)
Platelets: 302 10*3/uL (ref 150–400)
RBC: 4.3 MIL/uL (ref 3.87–5.11)
RDW: 14.3 % (ref 11.5–15.5)
WBC: 10.5 10*3/uL (ref 4.0–10.5)
nRBC: 0 % (ref 0.0–0.2)

## 2018-12-18 LAB — BASIC METABOLIC PANEL
Anion gap: 7 (ref 5–15)
BUN: 10 mg/dL (ref 6–20)
CO2: 21 mmol/L — ABNORMAL LOW (ref 22–32)
Calcium: 7.3 mg/dL — ABNORMAL LOW (ref 8.9–10.3)
Chloride: 106 mmol/L (ref 98–111)
Creatinine, Ser: 0.93 mg/dL (ref 0.44–1.00)
GFR calc Af Amer: >60 mL/min (ref >60)
GFR calc non Af Amer: >60 mL/min (ref >60)
Glucose, Bld: 88 mg/dL (ref 70–99)
Potassium: 3.5 mmol/L (ref 3.5–5.1)
Sodium: 134 mmol/L — ABNORMAL LOW (ref 135–145)

## 2018-12-18 LAB — HEMOGLOBIN A1C
HEMOGLOBIN A1C: 5 % (ref 4.8–5.6)
Mean Plasma Glucose: 96.8 mg/dL

## 2018-12-18 SURGERY — LAPAROSCOPIC CHOLECYSTECTOMY
Anesthesia: General

## 2018-12-18 MED ORDER — ONDANSETRON HCL 4 MG/2ML IJ SOLN
INTRAMUSCULAR | Status: AC
  Filled 2018-12-18: qty 2

## 2018-12-18 MED ORDER — FENTANYL CITRATE (PF) 100 MCG/2ML IJ SOLN
25.0000 ug | INTRAMUSCULAR | Status: AC | PRN
  Administered 2018-12-18 (×6): 25 ug via INTRAVENOUS

## 2018-12-18 MED ORDER — FENTANYL CITRATE (PF) 100 MCG/2ML IJ SOLN
INTRAMUSCULAR | Status: AC
  Filled 2018-12-18: qty 2

## 2018-12-18 MED ORDER — PHENYLEPHRINE HCL 10 MG/ML IJ SOLN
INTRAMUSCULAR | Status: DC | PRN
  Administered 2018-12-18 (×3): 100 ug via INTRAVENOUS

## 2018-12-18 MED ORDER — BUPIVACAINE-EPINEPHRINE (PF) 0.5% -1:200000 IJ SOLN: INTRAMUSCULAR | Status: DC | PRN

## 2018-12-18 MED ORDER — EPHEDRINE SULFATE 50 MG/ML IJ SOLN
INTRAMUSCULAR | Status: AC
  Filled 2018-12-18: qty 1

## 2018-12-18 MED ORDER — ONDANSETRON HCL 4 MG/2ML IJ SOLN: 4.0000 mg | Freq: Once | INTRAMUSCULAR | Status: DC | PRN

## 2018-12-18 MED ORDER — ONDANSETRON HCL 4 MG/2ML IJ SOLN
INTRAMUSCULAR | Status: DC | PRN
  Administered 2018-12-18: 4 mg via INTRAVENOUS

## 2018-12-18 MED ORDER — ROCURONIUM BROMIDE 50 MG/5ML IV SOLN
INTRAVENOUS | Status: AC
  Filled 2018-12-18: qty 1

## 2018-12-18 MED ORDER — DEXAMETHASONE SODIUM PHOSPHATE 10 MG/ML IJ SOLN
INTRAMUSCULAR | Status: DC | PRN
  Administered 2018-12-18: 10 mg via INTRAVENOUS

## 2018-12-18 MED ORDER — PROPOFOL 10 MG/ML IV BOLUS
INTRAVENOUS | Status: DC | PRN
  Administered 2018-12-18: 130 mg via INTRAVENOUS

## 2018-12-18 MED ORDER — HYDROCODONE-ACETAMINOPHEN 7.5-325 MG PO TABS
1.0000 | ORAL_TABLET | Freq: Four times a day (QID) | ORAL | Status: DC | PRN
  Administered 2018-12-18 – 2018-12-19 (×3): 1 via ORAL
  Filled 2018-12-18 (×3): qty 1

## 2018-12-18 MED ORDER — SUCCINYLCHOLINE CHLORIDE 20 MG/ML IJ SOLN
INTRAMUSCULAR | Status: DC | PRN
  Administered 2018-12-18: 100 mg via INTRAVENOUS

## 2018-12-18 MED ORDER — SUCCINYLCHOLINE CHLORIDE 20 MG/ML IJ SOLN
INTRAMUSCULAR | Status: AC
  Filled 2018-12-18: qty 1

## 2018-12-18 MED ORDER — ROCURONIUM BROMIDE 100 MG/10ML IV SOLN
INTRAVENOUS | Status: DC | PRN
  Administered 2018-12-18: 30 mg via INTRAVENOUS

## 2018-12-18 MED ORDER — DEXAMETHASONE SODIUM PHOSPHATE 10 MG/ML IJ SOLN
INTRAMUSCULAR | Status: AC
  Filled 2018-12-18: qty 1

## 2018-12-18 MED ORDER — LIDOCAINE HCL (CARDIAC) PF 100 MG/5ML IV SOSY
PREFILLED_SYRINGE | INTRAVENOUS | Status: DC | PRN
  Administered 2018-12-18: 100 mg via INTRAVENOUS

## 2018-12-18 MED ORDER — PROPOFOL 10 MG/ML IV BOLUS
INTRAVENOUS | Status: AC
  Filled 2018-12-18: qty 20

## 2018-12-18 MED ORDER — MIDAZOLAM HCL 2 MG/2ML IJ SOLN
INTRAMUSCULAR | Status: DC | PRN
  Administered 2018-12-18: 2 mg via INTRAVENOUS

## 2018-12-18 MED ORDER — SUGAMMADEX SODIUM 200 MG/2ML IV SOLN
INTRAVENOUS | Status: DC | PRN
  Administered 2018-12-18: 200 mg via INTRAVENOUS

## 2018-12-18 MED ORDER — BUPIVACAINE-EPINEPHRINE 0.5% -1:200000 IJ SOLN
INTRAMUSCULAR | Status: DC | PRN
  Administered 2018-12-18: 20 mL

## 2018-12-18 MED ORDER — ACETAMINOPHEN 10 MG/ML IV SOLN
INTRAVENOUS | Status: AC
  Filled 2018-12-18: qty 100

## 2018-12-18 MED ORDER — SUGAMMADEX SODIUM 200 MG/2ML IV SOLN
INTRAVENOUS | Status: AC
  Filled 2018-12-18: qty 2

## 2018-12-18 MED ORDER — ACETAMINOPHEN 10 MG/ML IV SOLN
INTRAVENOUS | Status: DC | PRN
  Administered 2018-12-18: 1000 mg via INTRAVENOUS

## 2018-12-18 MED ORDER — FENTANYL CITRATE (PF) 100 MCG/2ML IJ SOLN
INTRAMUSCULAR | Status: DC | PRN
  Administered 2018-12-18 (×3): 50 ug via INTRAVENOUS

## 2018-12-18 MED ORDER — PHENYLEPHRINE HCL 10 MG/ML IJ SOLN
INTRAMUSCULAR | Status: AC
  Filled 2018-12-18: qty 1

## 2018-12-18 MED ORDER — LIDOCAINE HCL (PF) 2 % IJ SOLN
INTRAMUSCULAR | Status: AC
  Filled 2018-12-18: qty 10

## 2018-12-18 MED ORDER — DOCUSATE SODIUM 100 MG PO CAPS: 200.0000 mg | ORAL_CAPSULE | Freq: Two times a day (BID) | ORAL | Status: DC

## 2018-12-18 MED ORDER — BUPIVACAINE-EPINEPHRINE (PF) 0.5% -1:200000 IJ SOLN
INTRAMUSCULAR | Status: AC
  Filled 2018-12-18: qty 90

## 2018-12-18 MED ORDER — MIDAZOLAM HCL 2 MG/2ML IJ SOLN
INTRAMUSCULAR | Status: AC
  Filled 2018-12-18: qty 2

## 2018-12-18 MED ORDER — GLYCOPYRROLATE 0.2 MG/ML IJ SOLN
INTRAMUSCULAR | Status: DC | PRN
  Administered 2018-12-18: 0.2 mg via INTRAVENOUS

## 2018-12-18 MED ORDER — BISACODYL 10 MG RE SUPP
10.0000 mg | Freq: Once | RECTAL | Status: AC
  Administered 2018-12-18: 10 mg via RECTAL
  Filled 2018-12-18: qty 1

## 2018-12-18 MED ORDER — GLYCOPYRROLATE 0.2 MG/ML IJ SOLN
INTRAMUSCULAR | Status: AC
  Filled 2018-12-18: qty 1

## 2018-12-18 SURGICAL SUPPLY — 39 items
ADH SKN CLS APL DERMABOND .7 (GAUZE/BANDAGES/DRESSINGS) ×1
APPLIER CLIP 5 13 M/L LIGAMAX5 (MISCELLANEOUS) ×2
APR CLP MED LRG 5 ANG JAW (MISCELLANEOUS) ×1
BAG SPEC RTRVL LRG 6X4 10 (ENDOMECHANICALS) ×1
BLADE SURG SZ11 CARB STEEL (BLADE) ×2 IMPLANT
CANISTER SUCT 1200ML W/VALVE (MISCELLANEOUS) ×2 IMPLANT
CHLORAPREP W/TINT 26ML (MISCELLANEOUS) ×2 IMPLANT
CLIP APPLIE 5 13 M/L LIGAMAX5 (MISCELLANEOUS) ×1 IMPLANT
COVER WAND RF STERILE (DRAPES) ×2 IMPLANT
DERMABOND ADVANCED (GAUZE/BANDAGES/DRESSINGS) ×1
DERMABOND ADVANCED .7 DNX12 (GAUZE/BANDAGES/DRESSINGS) ×1 IMPLANT
ELECT REM PT RETURN 9FT ADLT (ELECTROSURGICAL) ×2
ELECTRODE REM PT RTRN 9FT ADLT (ELECTROSURGICAL) ×1 IMPLANT
GLOVE BIO SURGEON STRL SZ 6.5 (GLOVE) ×2 IMPLANT
GOWN STRL REUS W/ TWL LRG LVL3 (GOWN DISPOSABLE) ×4 IMPLANT
GOWN STRL REUS W/TWL LRG LVL3 (GOWN DISPOSABLE) ×8
GRASPER SUT TROCAR 14GX15 (MISCELLANEOUS) IMPLANT
HEMOSTAT SURGICEL 2X3 (HEMOSTASIS) IMPLANT
IRRIGATION STRYKERFLOW (MISCELLANEOUS) ×1 IMPLANT
IRRIGATOR STRYKERFLOW (MISCELLANEOUS) ×2
IV NS 1000ML (IV SOLUTION) ×2
IV NS 1000ML BAXH (IV SOLUTION) ×1 IMPLANT
KIT TURNOVER KIT A (KITS) ×2 IMPLANT
LABEL OR SOLS (LABEL) ×2 IMPLANT
NDL HYPO 25X1 1.5 SAFETY (NEEDLE) ×1 IMPLANT
NDL INSUFFLATION 14GA 120MM (NEEDLE) ×1 IMPLANT
NEEDLE HYPO 25X1 1.5 SAFETY (NEEDLE) ×2 IMPLANT
NEEDLE INSUFFLATION 14GA 120MM (NEEDLE) ×2 IMPLANT
NS IRRIG 500ML POUR BTL (IV SOLUTION) ×2 IMPLANT
PACK LAP CHOLECYSTECTOMY (MISCELLANEOUS) ×2 IMPLANT
POUCH SPECIMEN RETRIEVAL 10MM (ENDOMECHANICALS) ×2 IMPLANT
SCISSORS METZENBAUM CVD 33 (INSTRUMENTS) ×2 IMPLANT
SLEEVE ENDOPATH XCEL 5M (ENDOMECHANICALS) ×4 IMPLANT
SUT MNCRL AB 4-0 PS2 18 (SUTURE) ×2 IMPLANT
SUT VIC AB 0 CT1 36 (SUTURE) IMPLANT
SUT VICRYL 0 AB UR-6 (SUTURE) ×2 IMPLANT
TROCAR XCEL NON-BLD 11X100MML (ENDOMECHANICALS) ×2 IMPLANT
TROCAR XCEL NON-BLD 5MMX100MML (ENDOMECHANICALS) ×2 IMPLANT
TUBING INSUFFLATION (TUBING) ×2 IMPLANT

## 2018-12-18 NOTE — Anesthesia Postprocedure Evaluation (Signed)
Anesthesia Post Note  Patient: Holly Fisher  Procedure(s) Performed: LAPAROSCOPIC CHOLECYSTECTOMY (N/A )  Patient location during evaluation: PACU Anesthesia Type: General Level of consciousness: awake and alert Pain management: pain level controlled Vital Signs Assessment: post-procedure vital signs reviewed and stable Respiratory status: spontaneous breathing, nonlabored ventilation, respiratory function stable and patient connected to nasal cannula oxygen Cardiovascular status: blood pressure returned to baseline and stable Postop Assessment: no apparent nausea or vomiting Anesthetic complications: no     Last Vitals:  Vitals:   12/18/18 1033 12/18/18 1039  BP: 100/61   Pulse: 69 79  Resp: 19 13  Temp: 36.9 C   SpO2: 99% 97%    Last Pain:  Vitals:   12/18/18 1039  TempSrc:   PainSc: 10-Worst pain ever                 Jaxyn Rout S

## 2018-12-18 NOTE — Progress Notes (Signed)
Paged surgeon on call to notify of pt c/o left abdominal pain. Pt has not had a BM since last wed. BS hypoactive and pt not passing gas. MD ordered dulcolax supp x 1. Will place order and cont to monitor

## 2018-12-18 NOTE — Op Note (Signed)
Preoperative diagnosis: Acute cholecystitis.  Postoperative diagnosis: Acute cholecystitis.  Procedure: Laparoscopic Cholecystectomy.   Anesthesia: GETA   Surgeon: Dr. Hazle Quantintron Diaz  Wound Classification: Clean Contaminated  Indications: Patient is a 32 y.o. female developed right upper quadrant pain, nausea and on workup was found to have cholelithiasis with a dilated common duct. MRCP and lipase and alkaline phosphatase normal. Ultrasound with sign of cholecystitis. Laparoscopic cholecystectomy was elected.  Findings: Significant edema and hyperemia of the gallbladder.  Critical view of safety achieved Cystic duct and artery identified, ligated and divided Adequate hemostasis  Description of procedure: The patient was placed on the operating table in the supine position. General anesthesia was induced. A time-out was completed verifying correct patient, procedure, site, positioning, and implant(s) and/or special equipment prior to beginning this procedure. An orogastric tube was placed. The abdomen was prepped and draped in the usual sterile fashion.  An incision was made in a natural skin line above the umbilicus.  The fascia was elevated and the Veress needle inserted. Proper position was confirmed by aspiration and saline meniscus test.  The abdomen was insufflated with carbon dioxide to a pressure of 15 mmHg. The patient tolerated insufflation well. A 11-mm trocar was then inserted.  The laparoscope was inserted and the abdomen inspected. No injuries from initial trocar placement were noted. Additional trocars were then inserted in the following locations: a 5-mm trocar in the right epigastrium and two 5-mm trocars along the right costal margin. The abdomen was inspected and no abnormalities were found. The table was placed in the reverse Trendelenburg position with the right side up.  Filmy adhesions between the gallbladder and omentum, duodenum and transverse colon were lysed sharply.  The dome of the gallbladder was grasped with an atraumatic grasper passed through the lateral port and retracted over the dome of the liver. The infundibulum was also grasped with an atraumatic grasper through the midclavicular port and retracted toward the right lower quadrant. This maneuver exposed Calot's triangle. The peritoneum overlying the gallbladder infundibulum was then incised and the cystic duct and cystic artery identified and circumferentially dissected. Critical view of safety reviewed before ligating any structure. The cystic duct and cystic artery were then doubly clipped and divided close to the gallbladder.  The gallbladder was then dissected from its peritoneal attachments by electrocautery. Hemostasis was checked and the gallbladder and contained stones were removed using an endoscopic retrieval bag placed through the umbilical port. The gallbladder was passed off the table as a specimen. The gallbladder fossa was copiously irrigated with saline and hemostasis was obtained. There was no evidence of bleeding from the gallbladder fossa or cystic artery or leakage of the bile from the cystic duct stump. Secondary trocars were removed under direct vision. No bleeding was noted. The laparoscope was withdrawn and the umbilical trocar removed. The abdomen was allowed to collapse. The fascia of the 11mm trocar sites was closed with figure-of-eight 0 vicryl sutures. The skin was closed with subcuticular sutures of 4-0 monocryl and topical skin adhesive. The orogastric tube was removed.  The patient tolerated the procedure well and was taken to the postanesthesia care unit in stable condition.   Specimen: Gallbladder  Complications: None  EBL: 10 mL

## 2018-12-18 NOTE — Consult Note (Signed)
Midge Minium, MD [facility]  [address], [address] [place], [place] [address] Phone: [phone] Fax : [phone]  Consultation  Referring Provider:     Dr. Alphonzo Lemmings Primary Care Physician:  Juliann Pares, DO Primary Gastroenterologist: Gentry Fitz         Reason for Consultation:     Right upper quadrant pain  Date of Admission:  12/17/2018 Date of Consultation:  12/18/2018         HPI:   Holly Fisher is a 32 y.o. female who was admitted to the hospital with right upper quadrant pain.  The patient had a imaging test that showed her to have a stone in the neck of the gallbladder and suggestive of acute cholecystitis.  The patient had normal liver enzymes.  The patient had a surgical consult for a laparoscopic cholecystectomy called and a GI consult called at that time. There was a report of a slight dilation of the common bile duct.  The patient was ordered and MRCP which did not show any bile duct stones.  Past Medical History:  Diagnosis Date  . Acute cholecystitis   . Psoriasis     Past Surgical History:  Procedure Laterality Date  . NO PAST SURGERIES      Prior to Admission medications   Medication Sig Start Date End Date Taking? Authorizing Provider  Adalimumab 40 MG/0.4ML PNKT Inject 40 mg into the skin. Every other week 07/05/18  Yes [provider]  levonorgestrel-ethinyl estradiol (JOLESSA) 0.15-0.03 MG tablet Take 1 tablet by mouth daily. 12/23/17  Yes [provider]    Family History  Problem Relation Age of Onset  . Autoimmune disease Mother   . Hypertension Mother   . Healthy Father      Social History   Tobacco Use  . Smoking status: Never Smoker  . Smokeless tobacco: Never Used  Substance Use Topics  . Alcohol use: Not Currently  . Drug use: Not Currently    Allergies as of 12/17/2018  . (No Known Allergies)    Review of Systems:    All systems reviewed and negative except where noted in HPI.   Physical Exam:  Vital  signs in last 24 hours: Temp:  [97.9 F (36.6 C)-99 F (37.2 C)] 98.4 F (36.9 C) (12/29 1033) Pulse Rate:  [61-96] 79 (12/29 1039) Resp:  [13-22] 13 (12/29 1039) BP: (96-146)/(48-77) 100/61 (12/29 1033) SpO2:  [97 %-100 %] 97 % (12/29 1039)   General:   Pleasant, cooperative in NAD Head:  Normocephalic and atraumatic. Eyes:   No icterus.   Conjunctiva pink. PERRLA. Psych:  Alert and cooperative. Normal affect.  LAB RESULTS: Recent Labs    12/17/18 0822 12/18/18 0545  WBC 8.4 10.5  HGB 12.7 11.5*  HCT 39.6 35.5*  PLT 331 302   BMET Recent Labs    12/17/18 0822 12/18/18 0545  NA 132* 134*  K 4.1 3.5  CL 103 106  CO2 21* 21*  GLUCOSE 136* 88  BUN 10 10  CREATININE 0.86 0.93  CALCIUM 8.7* 7.3*   LFT Recent Labs    12/18/18 0545  PROT 6.2*  ALBUMIN 2.9*  AST 24  ALT 25  ALKPHOS 49  BILITOT 0.7  BILIDIR 0.1  IBILI 0.6   PT/INR No results for input(s): LABPROT, INR in the last 72 hours.  STUDIES: Mr 3d Recon At Scanner  Result Date: 12/17/2018 CLINICAL DATA:  32 year old female with history of right upper quadrant abdominal pain. No fever or elevated  white blood count. Gallstones noted on recent ultrasound examination. EXAM: MRI ABDOMEN WITHOUT AND WITH CONTRAST (INCLUDING MRCP) TECHNIQUE: Multiplanar multisequence MR imaging of the abdomen was performed both before and after the administration of intravenous contrast. Heavily T2-weighted images of the biliary and pancreatic ducts were obtained, and three-dimensional MRCP images were rendered by post processing. CONTRAST:  7 mL of Gadavist. COMPARISON:  No prior abdominal MRI. Abdominal ultrasound 12/17/2018. FINDINGS: Lower chest: Unremarkable. Hepatobiliary: In segment 4A of the liver there is a subtle 12 mm lesion which is T1 isointense, mildly T2 hyperintense and demonstrates arterial phase hyperenhancement with persistent enhancement on delayed phase images. No other suspicious hepatic lesions are noted. No  intra or extrahepatic biliary ductal dilatation noted on MRCP images. Common bile duct measures 6 mm in the porta hepatis. Numerous filling defects are noted within the gallbladder, compatible with gallstones, including gallstones measuring up to 12 mm in the gallbladder neck. Gallbladder is only moderately distended. No gallbladder wall thickening. Trace amount of pericholecystic fluid. Pancreas: No pancreatic mass. No pancreatic ductal dilatation. No pancreatic or peripancreatic fluid or inflammatory changes. Spleen:  Unremarkable. Adrenals/Urinary Tract: Bilateral kidneys and bilateral adrenal glands are normal in appearance. No hydroureteronephrosis in the visualized portions of the abdomen. Stomach/Bowel: Visualized portions are unremarkable. Vascular/Lymphatic: No significant atherosclerotic disease or definite aneurysm identified in the visualized abdominal vasculature. No lymphadenopathy noted in the abdomen. Other: No significant volume of ascites noted in the visualized portions of the abdomen. Musculoskeletal: No aggressive appearing osseous lesions are noted in the visualized portions of the skeleton. IMPRESSION: 1. Cholelithiasis with a trace amount of pericholecystic fluid. Gallbladder is only moderately distended at this time. Findings are considered equivocal for acute cholecystitis. 2. No choledocholithiasis or evidence to suggest biliary tract obstruction at this time. 3. 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 This could be definitively characterized with follow-up MRI of the abdomen with and without IV gadolinium (Eovist) if of clinical concern. Electronically Signed   By: Trudie Reedaniel  Entrikin M.D.   On: 12/17/2018 15:47   Mr Abdomen Mrcp Vivien RossettiW Wo Contast  Result Date: 12/17/2018 CLINICAL DATA:  32 year old female with history of right upper quadrant  abdominal pain. No fever or elevated white blood count. Gallstones noted on recent ultrasound examination. EXAM: MRI ABDOMEN WITHOUT AND WITH CONTRAST (INCLUDING MRCP) TECHNIQUE: Multiplanar multisequence MR imaging of the abdomen was performed both before and after the administration of intravenous contrast. Heavily T2-weighted images of the biliary and pancreatic ducts were obtained, and three-dimensional MRCP images were rendered by post processing. CONTRAST:  7 mL of Gadavist. COMPARISON:  No prior abdominal MRI. Abdominal ultrasound 12/17/2018. FINDINGS: Lower chest: Unremarkable. Hepatobiliary: In segment 4A of the liver there is a subtle 12 mm lesion which is T1 isointense, mildly T2 hyperintense and demonstrates arterial phase hyperenhancement with persistent enhancement on delayed phase images. No other suspicious hepatic lesions are noted. No intra or extrahepatic biliary ductal dilatation noted on MRCP images. Common bile duct measures 6 mm in the porta hepatis. Numerous filling defects are noted within the gallbladder, compatible with gallstones, including gallstones measuring up to 12 mm in the gallbladder neck. Gallbladder is only moderately distended. No gallbladder wall thickening. Trace amount of pericholecystic fluid. Pancreas: No pancreatic mass. No pancreatic ductal dilatation. No pancreatic or peripancreatic fluid or inflammatory changes. Spleen:  Unremarkable. Adrenals/Urinary Tract: Bilateral kidneys and bilateral adrenal glands  are normal in appearance. No hydroureteronephrosis in the visualized portions of the abdomen. Stomach/Bowel: Visualized portions are unremarkable. Vascular/Lymphatic: No significant atherosclerotic disease or definite aneurysm identified in the visualized abdominal vasculature. No lymphadenopathy noted in the abdomen. Other: No significant volume of ascites noted in the visualized portions of the abdomen. Musculoskeletal: No aggressive appearing osseous lesions are  noted in the visualized portions of the skeleton. IMPRESSION: 1. Cholelithiasis with a trace amount of pericholecystic fluid. Gallbladder is only moderately distended at this time. Findings are considered equivocal for acute cholecystitis. 2. No choledocholithiasis or evidence to suggest biliary tract obstruction at this time. 3. 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 This could be definitively characterized with follow-up MRI of the abdomen with and without IV gadolinium (Eovist) if of clinical concern. Electronically Signed   By: Trudie Reedaniel  Entrikin M.D.   On: 12/17/2018 15:47   [place] Abdomen Limited Ruq  Result Date: 12/17/2018 CLINICAL DATA:  Right upper quadrant pain EXAM: ULTRASOUND ABDOMEN LIMITED RIGHT UPPER QUADRANT COMPARISON:  None. FINDINGS: Gallbladder: Multiple gallstones, including stone fixed at the gallbladder neck. The gallbladder is full but there is no wall thickening. Focal gallbladder tenderness per sonographer exam Common bile duct: Diameter: 12 mm Liver: No focal lesion identified. Within normal limits in parenchymal echogenicity. Portal vein is patent on color Doppler imaging with normal direction of blood flow towards the liver. IMPRESSION: 1. Cholelithiasis including stone fixed in the gallbladder neck. 2. Gallbladder is focally tender and may be obstructed. There is no wall thickening or edema typical of acute cholecystitis. 3. Dilated common bile duct (12 mm) without visible choledocholithiasis. Electronically Signed   By: Marnee SpringJonathon  Watts M.D.   On: 12/17/2018 09:34      Impression / Plan:   Assessment: Principal Problem:   Acute cholecystitis   Holly Fisher is a 32 y.o. y/o female with with acute cholecystitis and a negative MRCP with normal liver enzymes.  The patient went for a laparoscopic cholecystectomy this  morning.  Plan: This patient had acute cholecystitis without any sign of common bile duct stones or sludge.  The patient's LFTs were normal as was the MRCP.  The patient is doing well postop.  There is nothing further to do from a GI point of view. I will sign off.  Please call if any further GI concerns or questions.  We would like to thank you for the opportunity to participate in the care of Holly Fisher.    Thank you for involving me in the care of this patient.      LOS: 1 day   Midge Miniumarren Jemma Rasp, MD  12/18/2018, 11:18 AM    Note: This dictation was prepared with Dragon dictation along with smaller phrase technology. Any transcriptional errors that result from this process are unintentional.

## 2018-12-18 NOTE — Transfer of Care (Signed)
Immediate Anesthesia Transfer of Care Note  Patient: Holly Fisher  Procedure(s) Performed: LAPAROSCOPIC CHOLECYSTECTOMY (N/A )  Patient Location: PACU  Anesthesia Type:General  Level of Consciousness: awake and alert   Airway & Oxygen Therapy: Patient Spontanous Breathing and Patient connected to face mask oxygen  Post-op Assessment: Report given to RN and Post -op Vital signs reviewed and stable  Post vital signs: Reviewed and stable  Last Vitals:  Vitals Value Taken Time  BP 97/48 12/18/2018  9:49 AM  Temp 37.2 C 12/18/2018  9:48 AM  Pulse 70 12/18/2018  9:52 AM  Resp 19 12/18/2018  9:52 AM  SpO2 100 % 12/18/2018  9:52 AM  Vitals shown include unvalidated device data.  Last Pain:  Vitals:   12/18/18 0732  TempSrc: Oral  PainSc:          Complications: No apparent anesthesia complications

## 2018-12-18 NOTE — Progress Notes (Signed)
Pt voided, VS stable. Jewelry and clothes removed. CHG bath completed on prev. shift, report given to OR nurse by prev. shift. Pt off floor to OR. Family at bedside, followed pt to surgical waiting area.

## 2018-12-18 NOTE — Interval H&P Note (Signed)
History and Physical Interval Note:  12/18/2018 7:42 AM  Holly Fisher  has presented today for surgery, with the diagnosis of acute cholecystitis  The various methods of treatment have been discussed with the patient and family. After consideration of risks, benefits and other options for treatment, the patient has consented to  Procedure(s): LAPAROSCOPIC CHOLECYSTECTOMY (N/A) as a surgical intervention .  The patient's history has been reviewed, patient examined, no change in status, stable for surgery.  I have reviewed the patient's chart and labs.  Questions were answered to the patient's satisfaction.     Carolan ShiverEdgardo Cintron-Diaz

## 2018-12-18 NOTE — Anesthesia Preprocedure Evaluation (Signed)
Anesthesia Evaluation  Patient identified by MRN, date of birth, ID band Patient awake    Reviewed: Allergy & Precautions, NPO status , Patient's Chart, lab work & pertinent test results, reviewed documented beta blocker date and time   Airway Mallampati: III  TM Distance: >3 FB     Dental  (+) Chipped   Pulmonary           Cardiovascular      Neuro/Psych    GI/Hepatic   Endo/Other    Renal/GU      Musculoskeletal   Abdominal   Peds  Hematology   Anesthesia Other Findings Obese.  Reproductive/Obstetrics                             Anesthesia Physical Anesthesia Plan  ASA: III  Anesthesia Plan: General   Post-op Pain Management:    Induction: Intravenous  PONV Risk Score and Plan:   Airway Management Planned: Oral ETT  Additional Equipment:   Intra-op Plan:   Post-operative Plan:   Informed Consent: I have reviewed the patients History and Physical, chart, labs and discussed the procedure including the risks, benefits and alternatives for the proposed anesthesia with the patient or authorized representative who has indicated his/her understanding and acceptance.     Plan Discussed with: CRNA  Anesthesia Plan Comments:         Anesthesia Quick Evaluation  

## 2018-12-18 NOTE — Progress Notes (Signed)
Patient ID: Holly Fisher, female   DOB: 1986/05/02, 32 y.o.   MRN: [id number]  [facility] PROGRESS NOTE  Holly MoloneyCandace L Don WGN:[id number] DOB: 1986/05/02 DOA: 12/17/2018 PCP: Juliann ParesPowell, Michele, DO  HPI/Subjective: Patient seen after laparoscopic cholecystectomy.  Having a lot of abdominal pain.  Patient receiving IV pain medications advised to also do oral pain medications.  Objective: Vitals:   12/18/18 1130 12/18/18 1238  BP: 111/71 105/61  Pulse: 68 67  Resp: 14 15  Temp: 97.9 F (36.6 C) 98.3 F (36.8 C)  SpO2: 98% 100%    Filed Weights   12/17/18 0753  Weight: 77.1 kg    ROS: Review of Systems  Constitutional: Negative for chills and fever.  Eyes: Negative for blurred vision.  Respiratory: Negative for cough and shortness of breath.   Cardiovascular: Negative for chest pain.  Gastrointestinal: Positive for abdominal pain. Negative for constipation, diarrhea, nausea and vomiting.  Genitourinary: Negative for dysuria.  Musculoskeletal: Negative for joint pain.  Neurological: Negative for dizziness and headaches.   Exam: Physical Exam  Constitutional: She is oriented to person, place, and time.  HENT:  Nose: No mucosal edema.  Mouth/Throat: No oropharyngeal exudate or posterior oropharyngeal edema.  Eyes: Pupils are equal, round, and reactive to light. Conjunctivae, EOM and lids are normal.  Neck: No JVD present. Carotid bruit is not present. No edema present. No thyroid mass and no thyromegaly present.  Cardiovascular: S1 normal and S2 normal. Exam reveals no gallop.  No murmur heard. Pulses:      Dorsalis pedis pulses are 2+ on the right side and 2+ on the left side.  Respiratory: No respiratory distress. She has no wheezes. She has no rhonchi. She has no rales.  GI: Soft. Bowel sounds are normal. There is generalized abdominal tenderness.  Musculoskeletal:     Right ankle: She exhibits no swelling.     Left ankle: She exhibits no swelling.   Lymphadenopathy:    She has no cervical adenopathy.  Neurological: She is alert and oriented to person, place, and time. No cranial nerve deficit.  Skin: Skin is warm. No rash noted. Nails show no clubbing.  Psychiatric: She has a normal mood and affect.      Data Reviewed: Basic Metabolic Panel: Recent Labs  Lab 12/17/18 0822 12/18/18 0545  NA 132* 134*  K 4.1 3.5  CL 103 106  CO2 21* 21*  GLUCOSE 136* 88  BUN 10 10  CREATININE 0.86 0.93  CALCIUM 8.7* 7.3*   Liver Function Tests: Recent Labs  Lab 12/17/18 0822 12/18/18 0545  AST 26 24  ALT 25 25  ALKPHOS 65 49  BILITOT 0.4 0.7  PROT 8.1 6.2*  ALBUMIN 4.1 2.9*   Recent Labs  Lab 12/17/18 0822  LIPASE 27   CBC: Recent Labs  Lab 12/17/18 0822 12/18/18 0545  WBC 8.4 10.5  NEUTROABS 7.0  --   HGB 12.7 11.5*  HCT 39.6 35.5*  MCV 84.3 82.6  PLT 331 302     Studies: Mr 3d Recon At Scanner  Result Date: 12/17/2018 CLINICAL DATA:  32 year old female with history of right upper quadrant abdominal pain. No fever or elevated white blood count. Gallstones noted on recent ultrasound examination. EXAM: MRI ABDOMEN WITHOUT AND WITH CONTRAST (INCLUDING MRCP) TECHNIQUE: Multiplanar multisequence MR imaging of the abdomen was performed both before and after the administration of intravenous contrast. Heavily T2-weighted images of the biliary and pancreatic ducts were obtained, and three-dimensional MRCP images were rendered by post  processing. CONTRAST:  7 mL of Gadavist. COMPARISON:  No prior abdominal MRI. Abdominal ultrasound 12/17/2018. FINDINGS: Lower chest: Unremarkable. Hepatobiliary: In segment 4A of the liver there is a subtle 12 mm lesion which is T1 isointense, mildly T2 hyperintense and demonstrates arterial phase hyperenhancement with persistent enhancement on delayed phase images. No other suspicious hepatic lesions are noted. No intra or extrahepatic biliary ductal dilatation noted on MRCP images. Common bile  duct measures 6 mm in the porta hepatis. Numerous filling defects are noted within the gallbladder, compatible with gallstones, including gallstones measuring up to 12 mm in the gallbladder neck. Gallbladder is only moderately distended. No gallbladder wall thickening. Trace amount of pericholecystic fluid. Pancreas: No pancreatic mass. No pancreatic ductal dilatation. No pancreatic or peripancreatic fluid or inflammatory changes. Spleen:  Unremarkable. Adrenals/Urinary Tract: Bilateral kidneys and bilateral adrenal glands are normal in appearance. No hydroureteronephrosis in the visualized portions of the abdomen. Stomach/Bowel: Visualized portions are unremarkable. Vascular/Lymphatic: No significant atherosclerotic disease or definite aneurysm identified in the visualized abdominal vasculature. No lymphadenopathy noted in the abdomen. Other: No significant volume of ascites noted in the visualized portions of the abdomen. Musculoskeletal: No aggressive appearing osseous lesions are noted in the visualized portions of the skeleton. IMPRESSION: 1. Cholelithiasis with a trace amount of pericholecystic fluid. Gallbladder is only moderately distended at this time. Findings are considered equivocal for acute cholecystitis. 2. No choledocholithiasis or evidence to suggest biliary tract obstruction at this time. 3. 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 This could be definitively characterized with follow-up MRI of the abdomen with and without IV gadolinium (Eovist) if of clinical concern. Electronically Signed   By: Trudie Reed M.D.   On: 12/17/2018 15:47   Mr Abdomen Mrcp Vivien Rossetti Contast  Result Date: 12/17/2018 CLINICAL DATA:  32 year old female with history of right upper quadrant abdominal pain. No fever or elevated white blood count. Gallstones noted on recent  ultrasound examination. EXAM: MRI ABDOMEN WITHOUT AND WITH CONTRAST (INCLUDING MRCP) TECHNIQUE: Multiplanar multisequence MR imaging of the abdomen was performed both before and after the administration of intravenous contrast. Heavily T2-weighted images of the biliary and pancreatic ducts were obtained, and three-dimensional MRCP images were rendered by post processing. CONTRAST:  7 mL of Gadavist. COMPARISON:  No prior abdominal MRI. Abdominal ultrasound 12/17/2018. FINDINGS: Lower chest: Unremarkable. Hepatobiliary: In segment 4A of the liver there is a subtle 12 mm lesion which is T1 isointense, mildly T2 hyperintense and demonstrates arterial phase hyperenhancement with persistent enhancement on delayed phase images. No other suspicious hepatic lesions are noted. No intra or extrahepatic biliary ductal dilatation noted on MRCP images. Common bile duct measures 6 mm in the porta hepatis. Numerous filling defects are noted within the gallbladder, compatible with gallstones, including gallstones measuring up to 12 mm in the gallbladder neck. Gallbladder is only moderately distended. No gallbladder wall thickening. Trace amount of pericholecystic fluid. Pancreas: No pancreatic mass. No pancreatic ductal dilatation. No pancreatic or peripancreatic fluid or inflammatory changes. Spleen:  Unremarkable. Adrenals/Urinary Tract: Bilateral kidneys and bilateral adrenal glands are normal in appearance. No hydroureteronephrosis in the visualized portions of the abdomen. Stomach/Bowel: Visualized portions are unremarkable. Vascular/Lymphatic: No significant atherosclerotic disease or definite aneurysm identified in the visualized abdominal vasculature. No lymphadenopathy noted in the abdomen. Other: No significant volume of ascites noted in the visualized portions of the abdomen. Musculoskeletal: No aggressive appearing  osseous lesions are noted in the visualized portions of the skeleton. IMPRESSION: 1. Cholelithiasis with a  trace amount of pericholecystic fluid. Gallbladder is only moderately distended at this time. Findings are considered equivocal for acute cholecystitis. 2. No choledocholithiasis or evidence to suggest biliary tract obstruction at this time. 3. 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 This could be definitively characterized with follow-up MRI of the abdomen with and without IV gadolinium (Eovist) if of clinical concern. Electronically Signed   By: Trudie Reedaniel  Entrikin M.D.   On: 12/17/2018 15:47   [place] Abdomen Limited Ruq  Result Date: 12/17/2018 CLINICAL DATA:  Right upper quadrant pain EXAM: ULTRASOUND ABDOMEN LIMITED RIGHT UPPER QUADRANT COMPARISON:  None. FINDINGS: Gallbladder: Multiple gallstones, including stone fixed at the gallbladder neck. The gallbladder is full but there is no wall thickening. Focal gallbladder tenderness per sonographer exam Common bile duct: Diameter: 12 mm Liver: No focal lesion identified. Within normal limits in parenchymal echogenicity. Portal vein is patent on color Doppler imaging with normal direction of blood flow towards the liver. IMPRESSION: 1. Cholelithiasis including stone fixed in the gallbladder neck. 2. Gallbladder is focally tender and may be obstructed. There is no wall thickening or edema typical of acute cholecystitis. 3. Dilated common bile duct (12 mm) without visible choledocholithiasis. Electronically Signed   By: Marnee SpringJonathon  Watts M.D.   On: 12/17/2018 09:34    Scheduled Meds: . fentaNYL      . fentaNYL      . ketorolac  30 mg Intravenous Q6H  . levonorgestrel-ethinyl estradiol  1 tablet Oral QHS   Continuous Infusions: . sodium chloride 100 mL/hr at 12/18/18 0219  . piperacillin-tazobactam (ZOSYN)  IV 3.375 g (12/18/18 1356)    Assessment/Plan:   1. Acute cholecystitis. Patient came in with right upper  quadrant pain and there was a concern for stone in the gallbladder neck.  An MRCP was negative for stones in the gallbladder duct.  Patient was taken for laparoscopic cholecystectomy by Dr. Hazle Quantintron-Diaz.  Patient was given empiric antibiotics with Zosyn.  Patient has as needed nausea and pain medications.  And IV fluid hydration. 2. Impaired fasting glucose.  Hemoglobin A1c is 5.0.  The patient is not a diabetic. 3. Obesity with a BMI of 34.34.  Weight loss needed 4. Hyponatremia. Improved with fluid.    Case discussed with Dr. Hazle Quantintron-Diaz I will transfer care over to him since he operated.  I will sign off.  Code Status:     Code Status Orders  (From admission, onward)         Start     Ordered   12/17/18 1028  Full code  Continuous     12/17/18 1028        Code Status History    This patient has a current code status but no historical code status.     Family Communication: Parents at the bedside Disposition Plan: Likely discharge home tomorrow  Antibiotics:  Zosyn  Time spent: 25 minutes.  Medical service will sign off at this time and surgical service will take over.  Larwence Tu [employer]  [facility]

## 2018-12-18 NOTE — Anesthesia Procedure Notes (Signed)
Procedure Name: Intubation Date/Time: 12/18/2018 8:07 AM Performed by: Ginger CarneMichelet, Gunner Iodice, CRNA Pre-anesthesia Checklist: Patient identified, Emergency Drugs available, Suction available, Patient being monitored and Timeout performed Patient Re-evaluated:Patient Re-evaluated prior to induction Oxygen Delivery Method: Circle system utilized Preoxygenation: Pre-oxygenation with 100% oxygen Induction Type: IV induction Ventilation: Mask ventilation without difficulty and Oral airway inserted - appropriate to patient size Laryngoscope Size: Hyacinth MeekerMiller and 2 Grade View: Grade I Tube type: Oral Tube size: 7.5 mm Number of attempts: 1 Airway Equipment and Method: Stylet and Oral airway Placement Confirmation: ETT inserted through vocal cords under direct vision,  positive ETCO2 and breath sounds checked- equal and bilateral Secured at: 21 cm Tube secured with: Tape Dental Injury: Teeth and Oropharynx as per pre-operative assessment

## 2018-12-18 NOTE — Anesthesia Post-op Follow-up Note (Signed)
Anesthesia QCDR form completed.        

## 2018-12-19 ENCOUNTER — Encounter: Payer: Self-pay | Admitting: General Surgery

## 2018-12-19 MED ORDER — HYDROCODONE-ACETAMINOPHEN 5-325 MG PO TABS: 1.0000 | ORAL_TABLET | ORAL | 0 refills | Status: AC | PRN

## 2018-12-19 NOTE — Progress Notes (Signed)
Patient discharged home with family. Discharge instructions, follow up appointment, and prescriptions  reviewed with patient. Patient verbalized understanding. Home medication returned to patient from being stored in hospital pharmacy. Pt refused wheelchair ride out- walking off unit with family.

## 2018-12-19 NOTE — Progress Notes (Signed)
Pt up walking in hall, gave pt prune juice also. Still no results from supp at this time. Will cont to monitor

## 2018-12-19 NOTE — Discharge Instructions (Signed)
°  Diet: Resume home heart healthy regular diet.   Activity: No heavy lifting >20 pounds (children, pets, laundry, garbage) or strenuous activity until follow-up, but light activity and walking are encouraged. Do not drive or drink alcohol if taking narcotic pain medications.  May return to work in two weeks (01/02/2018).   Wound care: May shower with soapy water and pat dry (do not rub incisions), but no baths or submerging incision underwater until follow-up. (no swimming)   Medications: Resume all home medications. For mild to moderate pain: acetaminophen (Tylenol) or ibuprofen (if no kidney disease). Combining Tylenol with alcohol can substantially increase your risk of causing liver disease. Narcotic pain medications, if prescribed, can be used for severe pain, though may cause nausea, constipation, and drowsiness. Do not combine Tylenol and Norco within a 6 hour period as Norco contains Tylenol. If you do not need the narcotic pain medication, you do not need to fill the prescription.  If develops constipation may use Miralax up to twice BID.   Call office [phone]) at any time if any questions, worsening pain, fevers/chills, bleeding, drainage from incision site, or other concerns.

## 2018-12-19 NOTE — Progress Notes (Signed)
Prime doc paged to notify of pt coughing up bloody mucous plug. VS stable, no chest pain sob. No new orders

## 2018-12-19 NOTE — Discharge Summary (Signed)
  Patient ID: Holly MoloneyCandace L Exley MRN: [id number] DOB/AGE: [birth year] 30 y.o.  Admit date: 12/17/2018 Discharge date: 12/19/2018   Discharge Diagnoses:  Principal Problem:   Acute cholecystitis Active Problems:   RUQ abdominal pain   Procedures: Laparoscopic cholecystectomy   Hospital Course: Patient admitted wit suspected cholecystitis with enlarged common bile duct. MRCP confirmed that the common bile duct is normal and there was no choledocholithiasis. Patient taken to OR for laparoscopic cholecystectomy. Tolerated procedure well. Tolerating diet. Pain controlled, Ambulating. Had bowel movement and voiding spontaneously.   Physical Exam  Constitutional: She is well-developed, well-nourished, and in no distress.  HENT:  Head: Normocephalic.  Cardiovascular: Normal rate and regular rhythm.  Pulmonary/Chest: Effort normal and breath sounds normal. No respiratory distress.  Abdominal: Soft. Bowel sounds are normal. She exhibits no distension. There is no abdominal tenderness.   Consults: None  Disposition: Home   Discharge Instructions    Diet - low sodium heart healthy   Complete by:  As directed      Allergies as of 12/19/2018   No Known Allergies     Medication List    TAKE these medications   Adalimumab 40 MG/0.4ML Pnkt Inject 40 mg into the skin. Every other week   HYDROcodone-acetaminophen 5-325 MG tablet Commonly known as:  NORCO Take 1 tablet by mouth every 4 (four) hours as needed for up to 3 days for moderate pain.   JOLESSA 0.15-0.03 MG tablet Generic drug:  levonorgestrel-ethinyl estradiol Take 1 tablet by mouth daily.      Follow-up Information    Carolan Shiverintron-Diaz, Yamira Papa, MD Follow up in 2 week(s).   Specialty:  General Surgery Contact information: [address] [place] [address] [phone]

## 2018-12-20 LAB — SURGICAL PATHOLOGY

## 2018-12-20 LAB — HIV ANTIBODY (ROUTINE TESTING W REFLEX): HIV Screen 4th Generation wRfx: NONREACTIVE

## 2019-02-27 IMAGING — MR MR 3D RECON AT SCANNER
21 of 23 series · 43 of 48 positions shown · IV contrast (Gadavist)
Comparison: No prior abdominal MRI. Abdominal ultrasound
12/17/2018.

CLINICAL DATA: 32-year-old female with history of right upper
quadrant abdominal pain. No fever or elevated white blood count.
Gallstones noted on recent ultrasound examination.

EXAM:
MRI ABDOMEN WITHOUT AND WITH CONTRAST (INCLUDING MRCP)
TECHNIQUE: Multiplanar multisequence MR imaging of the abdomen was performed
both before and after the administration of intravenous contrast.
Heavily T2-weighted images of the biliary and pancreatic ducts were
obtained, and three-dimensional MRCP images were rendered by post
processing.
CONTRAST:  7 mL of Gadavist.

[Series 3: bSSFP · coronal · 6.0mm · 0.74mm/px · 2 of 30 slices shown]
[im 1/30]
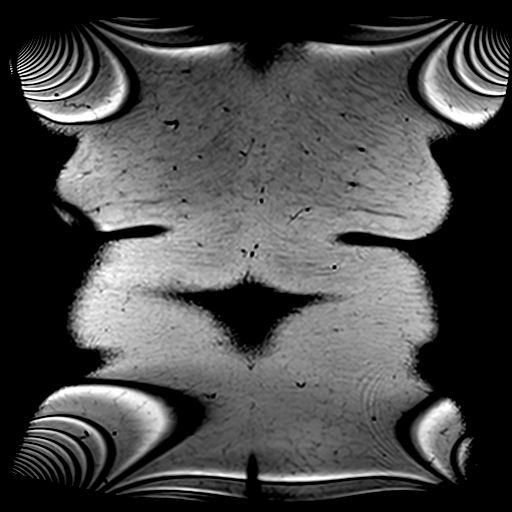
[im 30/30]
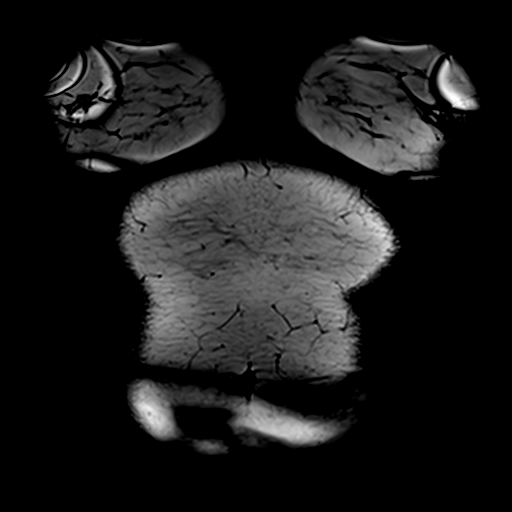

[Series 4: T2 · axial · 6.0mm · 1.19mm/px · z∈[-57,+167]mm · 2 of 32 slices shown]
[im 1/32]
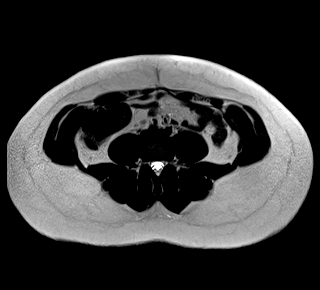
[im 32/32]
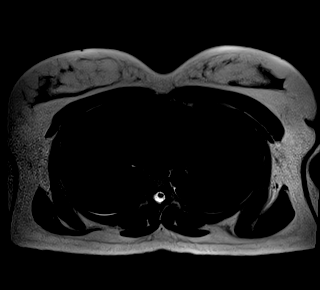

[Series 5: T1 · axial · 6.0mm · 0.74mm/px · z∈[-57,+167]mm · 2 of 32 slices shown (1 of 2)]
[im 1/32]
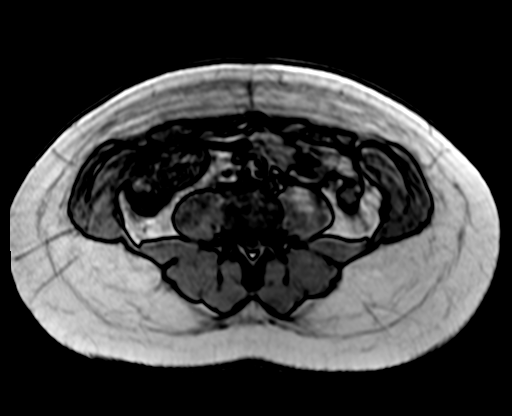
[im 32/32]
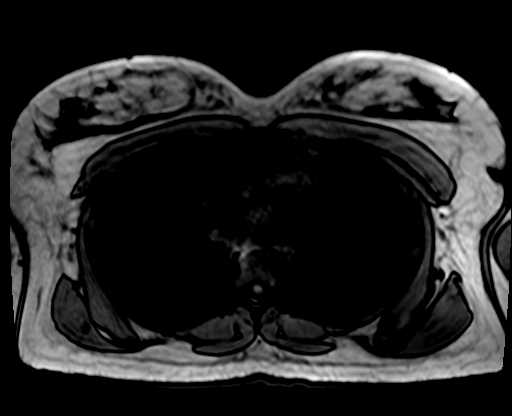

[Series 5: T1 · axial · 6.0mm · 0.74mm/px · 1 of 32 slices shown (2 of 2)]
[im 1/32]
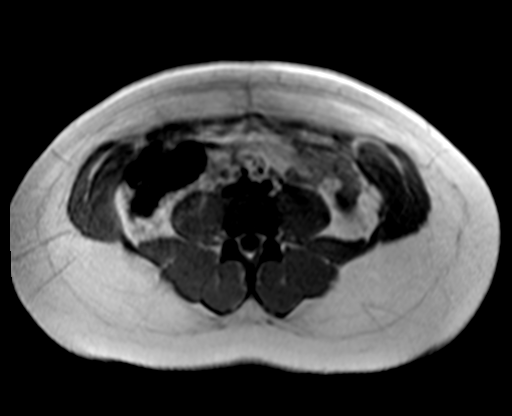

[Series 8: T2 fat-sat · axial · 6.0mm · 1.19mm/px · 1 of 34 slices shown]
[im 1/34]
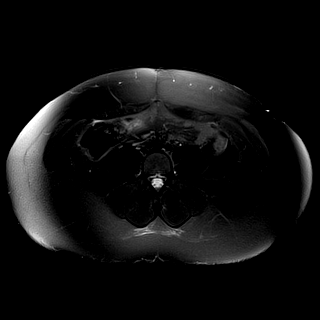

[Series 9: ax dwi_tracew · axial · 6.0mm · 1.42mm/px · 1 of 34 slices shown (1 of 3)]
[im 1/34]
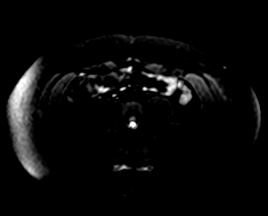

[Series 9: ax dwi_tracew · axial · 6.0mm · 1.42mm/px · 1 of 34 slices shown (2 of 3)]
[im 1/34]
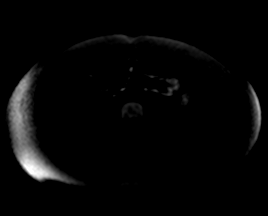

[Series 9: ax dwi_tracew · axial · 6.0mm · 1.42mm/px · 1 of 34 slices shown (3 of 3)]
[im 1/34]
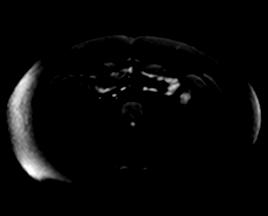

[Series 10: ax dwi_adc · axial · 6.0mm · 1.42mm/px · 1 of 34 slices shown]
[im 1/34]
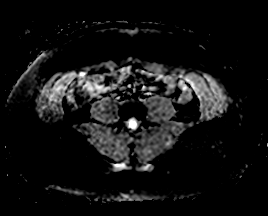

[Series 15: MRCP · coronal · 3.0mm · 1.12mm/px · 1 of 17 slices shown]
[im 1/17]
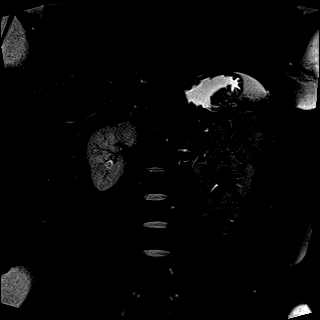

[Series 16: radials · coronal · 50.0mm · 0.78mm/px · 1 of 5 slices shown]
[im 1/5]
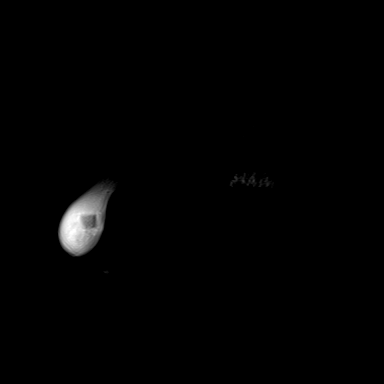

[Series 17: T1 dynamic fat-sat · axial · non-contrast · 3.0mm · 1.19mm/px · z∈[-52,+161]mm · 3 of 72 slices shown (1 of 5)]
[im 1/72]
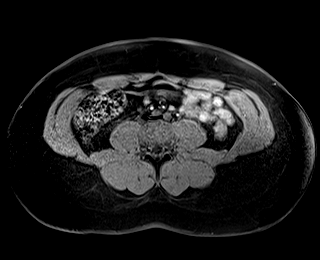
[im 36/72]
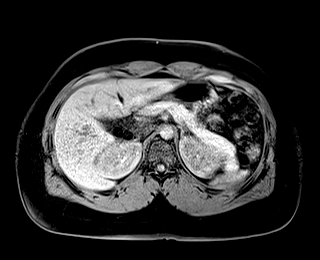
[im 72/72]
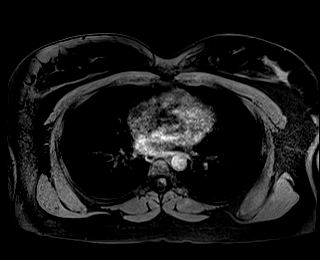

[Series 18: T1 dynamic fat-sat post-contrast · axial · 3.0mm · 1.19mm/px · z∈[-52,+161]mm · 3 of 72 slices shown (1 of 4)]
[im 1/72]
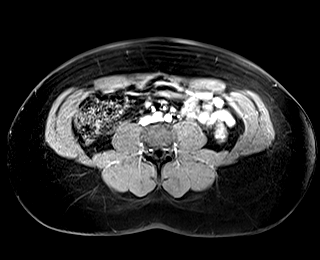
[im 36/72]
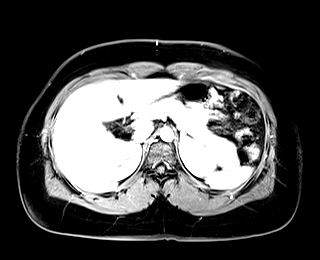
[im 72/72]
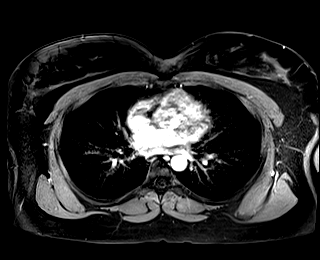

[Series 19: T1 dynamic fat-sat · axial · 3.0mm · 1.19mm/px · z∈[-52,+161]mm · 3 of 72 slices shown (2 of 5)]
[im 1/72]
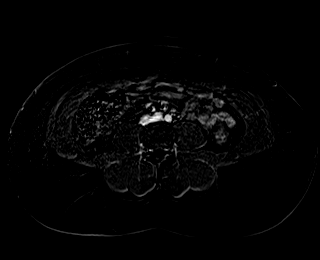
[im 36/72]
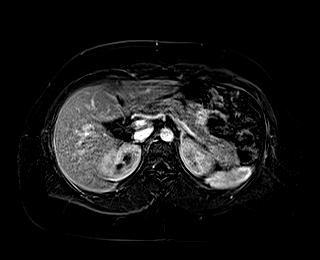
[im 72/72]
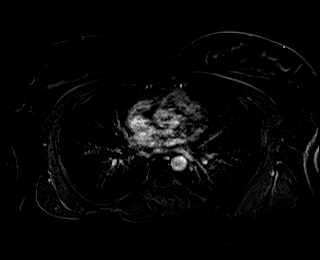

[Series 20: T1 dynamic fat-sat post-contrast · axial · 3.0mm · 1.19mm/px · z∈[-52,+161]mm · 3 of 72 slices shown (2 of 4)]
[im 1/72]
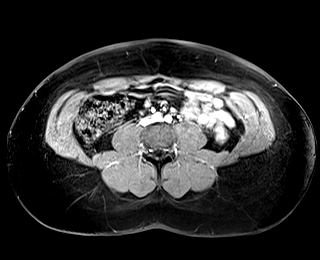
[im 36/72]
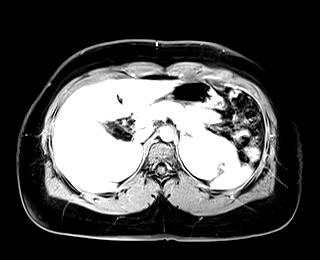
[im 72/72]
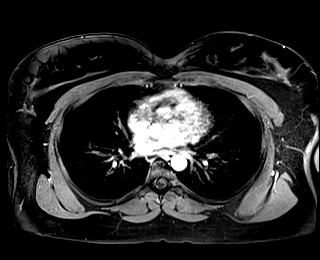

[Series 21: T1 dynamic fat-sat · axial · 3.0mm · 1.19mm/px · z∈[-52,+161]mm · 3 of 72 slices shown (3 of 5)]
[im 1/72]
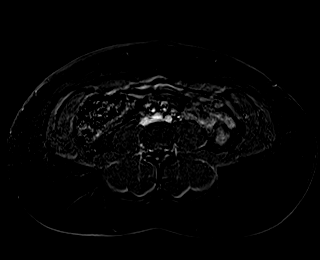
[im 36/72]
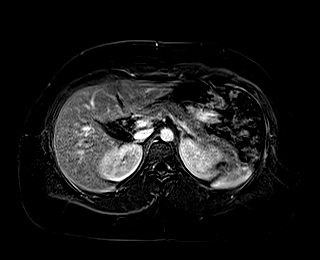
[im 72/72]
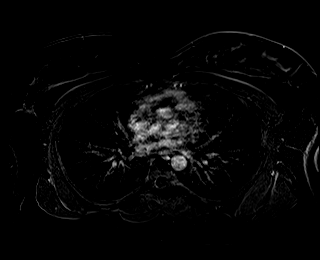

[Series 22: T1 dynamic fat-sat post-contrast · axial · 3.0mm · 1.19mm/px · z∈[-52,+161]mm · 3 of 72 slices shown (3 of 4)]
[im 1/72]
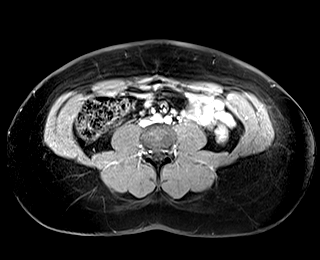
[im 36/72]
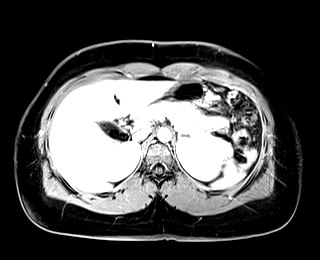
[im 72/72]
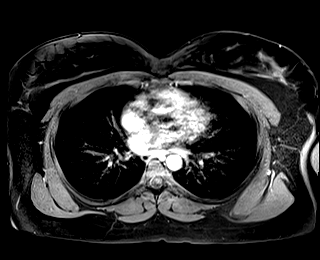

[Series 23: T1 dynamic fat-sat · axial · 3.0mm · 1.19mm/px · z∈[-52,+161]mm · 3 of 72 slices shown (4 of 5)]
[im 1/72]
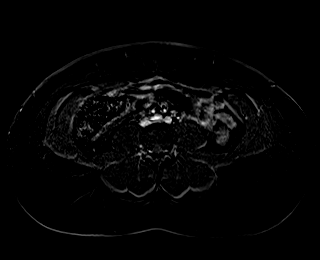
[im 36/72]
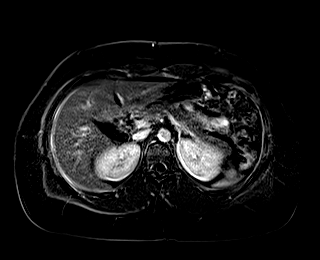
[im 72/72]
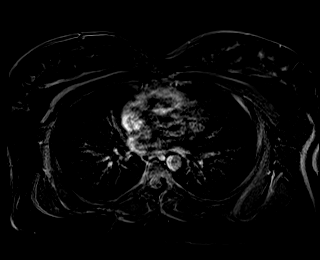

[Series 25: T1 dynamic fat-sat post-contrast · axial · 3.0mm · 1.19mm/px · z∈[-52,+161]mm · 3 of 72 slices shown (4 of 4)]
[im 1/72]
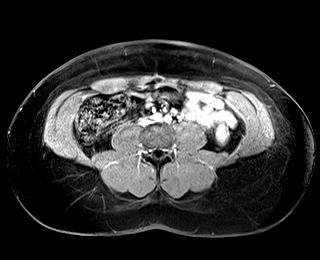
[im 36/72]
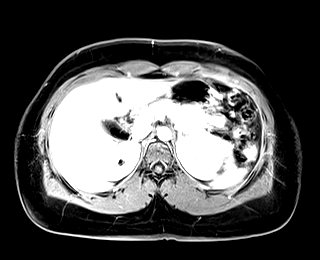
[im 72/72]
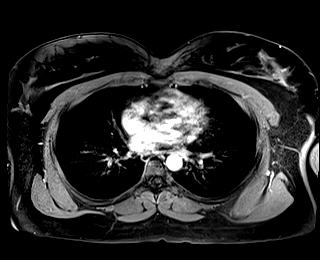

[Series 26: T1 dynamic fat-sat · axial · 3.0mm · 1.19mm/px · z∈[-52,+161]mm · 3 of 72 slices shown (5 of 5)]
[im 1/72]
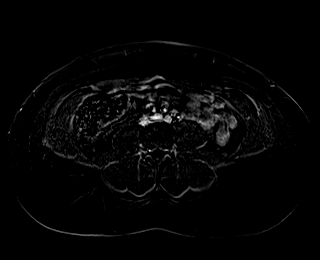
[im 36/72]
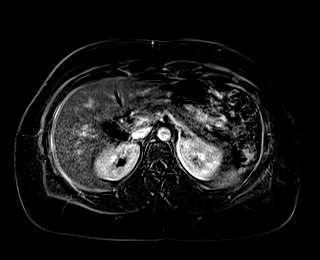
[im 72/72]
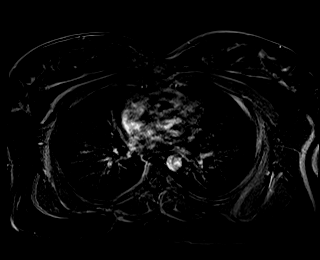

[Series 27: T1 dynamic post-contrast · coronal · 3.5mm · 1.31mm/px · 2 of 60 slices shown]
[im 1/60]
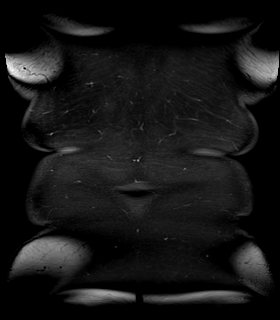
[im 30/60]
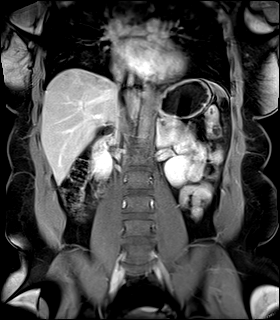

[43 of 48 positions shown; findings below may reference images not displayed]

FINDINGS: Lower chest: Unremarkable.

Hepatobiliary: In segment 4A of the liver there is a subtle 12 mm
lesion which is T1 isointense, mildly T2 hyperintense and
demonstrates arterial phase hyperenhancement with persistent
enhancement on delayed phase images. No other suspicious hepatic
lesions are noted. No intra or extrahepatic biliary ductal
dilatation noted on MRCP images. Common bile duct measures 6 mm in
the porta hepatis. Numerous filling defects are noted within the
gallbladder, compatible with gallstones, including gallstones
measuring up to 12 mm in the gallbladder neck. Gallbladder is only
moderately distended. No gallbladder wall thickening. Trace amount
of pericholecystic fluid.

Pancreas: No pancreatic mass. No pancreatic ductal dilatation. No
pancreatic or peripancreatic fluid or inflammatory changes.

Spleen:  Unremarkable.

Adrenals/Urinary Tract: Bilateral kidneys and bilateral adrenal
glands are normal in appearance. No hydroureteronephrosis in the
visualized portions of the abdomen.

Stomach/Bowel: Visualized portions are unremarkable.

Vascular/Lymphatic: No significant atherosclerotic disease or
definite aneurysm identified in the visualized abdominal
vasculature. No lymphadenopathy noted in the abdomen.

Other: No significant volume of ascites noted in the visualized
portions of the abdomen.

Musculoskeletal: No aggressive appearing osseous lesions are noted
in the visualized portions of the skeleton.
IMPRESSION: 1. Cholelithiasis with a trace amount of pericholecystic fluid.
Gallbladder is only moderately distended at this time. Findings are
considered equivocal for acute cholecystitis.
2. No choledocholithiasis or evidence to suggest biliary tract
obstruction at this time.
3. 12 mm lesion in segment 4A of the liver which has indeterminate
imaging characteristics, most compatible with focal nodular
hyperplasia (FNH) or a small flash fill cavernous hemangioma. This
could be definitively characterized with follow-up MRI of the
abdomen with and without IV gadolinium (Eovist) if of clinical
concern.

## 2020-02-24 ENCOUNTER — Other Ambulatory Visit: Payer: Self-pay

## 2020-02-24 ENCOUNTER — Ambulatory Visit
Admission: EM | Admit: 2020-02-24 | Discharge: 2020-02-24 | Disposition: A | Discharge status: Home / Self Care | Payer: BC Managed Care – PPO | Attending: Family Medicine | Admitting: Family Medicine

## 2020-02-24 ENCOUNTER — Encounter: Payer: Self-pay | Admitting: Emergency Medicine

## 2020-02-24 DIAGNOSIS — J039 Acute tonsillitis, unspecified: Secondary | ICD-10-CM | POA: Insufficient documentation

## 2020-02-24 DIAGNOSIS — Z20822 Contact with and (suspected) exposure to covid-19: Secondary | ICD-10-CM | POA: Diagnosis present

## 2020-02-24 LAB — GROUP A STREP BY PCR: Group A Strep by PCR: NOT DETECTED

## 2020-02-24 MED ORDER — AMOXICILLIN 500 MG PO TABS: 500.0000 mg | ORAL_TABLET | Freq: Two times a day (BID) | ORAL | 0 refills | Status: AC

## 2020-02-24 MED ORDER — FLUCONAZOLE 150 MG PO TABS: ORAL_TABLET | ORAL | 0 refills | Status: DC

## 2020-02-24 NOTE — Discharge Instructions (Addendum)
It was very nice seeing you today in clinic. Thank you for entrusting me with your care.   Rest and Stay HYDRATED. Water and electrolyte containing beverages (Gatorade, Pedialyte) are best to prevent dehydration and electrolyte abnormalities.  Recommended warm salt water gargles, hard candies/lozenges, and hot tea with honey/lemon to help soothe the throat and reduce irritation. May use Tylenol and/or Ibuprofen as needed for pain/fever.  Please utilize the medications that we discussed. Your prescriptions has been called in to your pharmacy.   You were tested for SARS-CoV-2 (novel coronavirus) today. Testing is performed by an outside lab ([facility]) and has variable turn around times ranging between 24-48 hours. Current recommendations from the the CDC and [place] DHHS require that you remain out of work in order to quarantine at home until negative test results are have been received. In the event that your test results are positive, you will be contacted with further directives. These measures are being implemented out of an abundance of caution to prevent transmission and spread during the current SARS-CoV-2 pandemic.  Make arrangements to follow up with your regular doctor in 1 week for re-evaluation if not improving. If your symptoms/condition worsens, please seek follow up care either here or in the ER. Please remember, our [facility] providers are "right here with you" when you need [place].   Again, it was my pleasure to take care of you today. Thank you for choosing our clinic. I hope that you start to feel better quickly.   Quentin Mulling, MSN, APRN, FNP-C, CEN Advanced Practice Provider [facility]

## 2020-02-24 NOTE — ED Provider Notes (Addendum)
[place], [place]   Name: Holly Fisher DOB: 1986/02/05 MRN: [id number] CSN: [id number] PCP: Ann Maki, DO  Arrival date and time:  02/24/20 0801  Chief Complaint:  Sore Throat, Chills, Headache, and Fever   NOTE: Prior to seeing the patient today, I have reviewed the triage nursing documentation and vital signs. Clinical staff has updated patient's PMH/PSHx, current medication list, and drug allergies/intolerances to ensure comprehensive history available to assist in medical decision making.   History:   HPI: Holly Fisher is a 34 y.o. female who presents today with complaints of sore throat, diffuse myalgias, and sore throat that started yesterday. Patient has been running an elevated temperature and having chills; reported Tmax has been 100. She denies any cough, shortness of breath, or wheezing. She denies that she has experienced any nausea, vomiting, diarrhea, or abdominal pain. She is eating and drinking well. Patient denies any perceived alterations to her sense of taste or smell. Patient denies being in close contact with anyone known to be ill; no one else is her home has experienced a similar symptom constellation. She has never been tested for SARS-CoV-2 (novel coronavirus) in the past per her report. Patient has not been vaccinated for influenza this season. In efforts to conservatively manage her symptoms at home, the patient notes that she has used guaifenesin, APAP PM, and APAP cold/sinus, none of which have helped to improve her symptoms.    Past Medical History:  Diagnosis Date  . Acute cholecystitis   . Psoriasis     Past Surgical History:  Procedure Laterality Date  . CHOLECYSTECTOMY N/A 12/18/2018   Procedure: LAPAROSCOPIC CHOLECYSTECTOMY;  Surgeon: Herbert Pun, MD;  Location: [facility] ORS;  Service: General;  Laterality: N/A;    Family History  Problem Relation Age of Onset  . Autoimmune disease Mother   . Hypertension Mother   . Healthy Father       Social History   Tobacco Use  . Smoking status: Never Smoker  . Smokeless tobacco: Never Used  Substance Use Topics  . Alcohol use: Not Currently  . Drug use: Not Currently    There are no problems to display for this patient.   Home Medications:    Current Meds  Medication Sig  . levonorgestrel-ethinyl estradiol (JOLESSA) 0.15-0.03 MG tablet Take 1 tablet by mouth daily.  . Risankizumab-rzaa,150 MG Dose, (SKYRIZI, 150 MG DOSE,) 75 MG/0.83ML PSKT Inct150 mg into skin at week zero ,4 then every 12 weeks    Allergies:   Patient has no known allergies.  Review of Systems (ROS):  Review of systems NEGATIVE unless otherwise noted in narrative H&P section.   Vital Signs: Today's Vitals   02/24/20 0815 02/24/20 0816 02/24/20 0859  BP: 110/67    Pulse: 88    Resp: 18    Temp: 99.1 F (37.3 C)    TempSrc: Oral    Weight:  160 lb (72.6 kg)   Height:  5' (1.524 m)   PainSc: 9   9     Physical Exam: Physical Exam  Constitutional: She is oriented to person, place, and time and well-developed, well-nourished, and in no distress.  Acutely ill appearing; fatigued/listless.  HENT:  Head: Normocephalic and atraumatic.  Right Ear: Tympanic membrane normal.  Left Ear: Tympanic membrane normal.  Nose: Sinus tenderness (pressure) present. No mucosal edema or rhinorrhea.  Mouth/Throat: Uvula is midline. Posterior oropharyngeal edema and posterior oropharyngeal erythema present.  Tonsils grade II with (+) white exudative patches. No dysphagia; able  to handle oral secretions and thin liquids. No SOB, stridor, or wheezing. Phonation normal.  Eyes: Pupils are equal, round, and reactive to light.  Cardiovascular: Normal rate, regular rhythm, normal heart sounds and intact distal pulses.  Pulmonary/Chest: Effort normal and breath sounds normal.  No cough noted in clinic. No SOB or increased WOB. No distress. Able to speak in complete sentences without difficulties. SPO2 100% on RA.   Lymphadenopathy:       Head (right side): Submandibular and tonsillar adenopathy present.       Head (left side): Submandibular and tonsillar adenopathy present.  Neurological: She is alert and oriented to person, place, and time. Gait normal.  Skin: Skin is warm and dry. No rash noted. She is not diaphoretic.  Psychiatric: Mood, memory, affect and judgment normal.  Nursing note and vitals reviewed.   Urgent Care Treatments / Results:   Orders Placed This Encounter  Procedures  . SARS CORONAVIRUS 2 (TAT 6-24 HRS) Nasopharyngeal Nasopharyngeal Swab  . Group A Strep by PCR    LABS: PLEASE NOTE: all labs that were ordered this encounter are listed, however only abnormal results are displayed. Labs Reviewed  GROUP A STREP BY PCR  SARS CORONAVIRUS 2 (TAT 6-24 HRS)    EKG: -None  RADIOLOGY: No results found.  PROCEDURES: Procedures  MEDICATIONS RECEIVED THIS VISIT: Medications - No data to display  PERTINENT CLINICAL COURSE NOTES/UPDATES:   Initial Impression / Assessment and Plan / Urgent Care Course:  Pertinent labs & imaging results that were available during my care of the patient were personally reviewed by me and considered in my medical decision making (see lab/imaging section of note for values and interpretations).  Holly Fisher is a 34 y.o. female who presents to [facility] today with complaints of Sore Throat, Chills, Headache, and Fever  Patient overall well appearing and in no acute distress today in clinic. Presenting symptoms (see HPI) and exam as documented above. She presents with symptoms associated with SARS-CoV-2 (novel coronavirus). Discussed typical symptom constellation. Reviewed potential for infection and need for testing. Patient amenable to being tested. SARS-CoV-2 swab collected by certified clinical staff. Discussed variable turn around times associated with testing, as swabs are being processed at the main campus of [facility] in  [place], and have been taking 12-24 hours to come back. She was advised to self quarantine, per [facility] DHHS guidelines, until negative results received. These measures are being implemented out of an abundance of caution to prevent transmission and spread during the current SARS-CoV-2 pandemic.  PCR streptococcal throat swab (-). Presenting symptoms consistent with acute exudative tonsillitis. With that being said, discussed that until ruled out with confirmatory lab testing, SARS-CoV-2 remains part of the differential. Hertesting is pending at this time. Given symptoms and exam, will proceed with treatment using a 10 day course of amoxicillin. Discussed supportive care measures at home during acute phase of illness. Patient to rest as much as possible. She was encouraged to ensure adequate hydration (water and ORS) to prevent dehydration and electrolyte derangements. Recommended warm salt water gargles, hard candies/lozenges, and hot tea with honey/lemon to help soothe the throat and reduce irritation. May use Tylenol and/or Ibuprofen as needed for pain/fever. Patient has has a history of vulvovaginal candidiasis in the past while on oral antimicrobial therapy. Will send in prophylactic fluconazole dose (150 mg x 1 - may repeat in 72 hours if still symptomatic) for patient to use should she develop symptoms.  Current clinical condition warrants  patient being out of work in order to quarantine while waiting for testing results. She was provided with the appropriate documentation to provide to her place of employment that will allow for her to RTW on 02/27/2020 with no restrictions. RTW is contingent on her SARS-CoV-2 test results being reviewed as negative.     Discussed follow up with primary care physician in 1 week for re-evaluation. I have reviewed the follow up and strict return precautions for any new or worsening symptoms. Patient is aware of symptoms that would be deemed urgent/emergent, and would thus  require further evaluation either here or in the emergency department. At the time of discharge, she verbalized understanding and consent with the discharge plan as it was reviewed with her. All questions were fielded by provider and/or clinic staff prior to patient discharge.    Final Clinical Impressions / Urgent Care Diagnoses:   Final diagnoses:  Exudative tonsillitis  Encounter for laboratory testing for COVID-19 virus    New Prescriptions:  [place] Controlled Substance Registry consulted? Not Applicable  Meds ordered this encounter  Medications  . amoxicillin (AMOXIL) 500 MG tablet    Sig: Take 1 tablet (500 mg total) by mouth 2 (two) times daily for 10 days.    Dispense:  20 tablet    Refill:  0  . fluconazole (DIFLUCAN) 150 MG tablet    Sig: Take 1 tablet (150 mg) PO x 1 dose. May repeat 150 mg dose in 3 days if still symptomatic.    Dispense:  2 tablet    Refill:  0    Recommended Follow up Care:  Patient encouraged to follow up with the following provider within the specified time frame, or sooner as dictated by the severity of her symptoms. As always, she was instructed that for any urgent/emergent care needs, she should seek care either here or in the emergency department for more immediate evaluation.  Follow-up Information    Juliann Pares, DO In 1 week.   Specialty: Family Medicine Why: General reassessment of symptoms if not improving Contact information: [address] [place] [address] [phone]         NOTE: This note was prepared using Dragon dictation software along with smaller phrase technology. Despite my best ability to proofread, there is the potential that transcriptional errors may still occur from this process, and are completely unintentional.    Verlee Monte, NP 02/24/20 2318

## 2020-02-24 NOTE — ED Triage Notes (Signed)
Patient in today c/o sore throat, chills, headache and fever (100) since yesterday. Patient has tried  OTC Mucinex PM, Tylenol PM Tylenol sinus without relief. Patient's last dose of Tylenol was ~7pm last night. Patient has had covid vaccine x 1 on 02/16/20.

## 2020-02-25 LAB — SARS CORONAVIRUS 2 (TAT 6-24 HRS): SARS Coronavirus 2: NEGATIVE

## 2022-08-03 ENCOUNTER — Other Ambulatory Visit: Payer: Self-pay

## 2022-08-03 ENCOUNTER — Ambulatory Visit
Admission: EM | Admit: 2022-08-03 | Discharge: 2022-08-03 | Disposition: A | Discharge status: Home / Self Care | Payer: BC Managed Care – PPO | Attending: Family Medicine | Admitting: Family Medicine

## 2022-08-03 ENCOUNTER — Ambulatory Visit (INDEPENDENT_AMBULATORY_CARE_PROVIDER_SITE_OTHER): Payer: BC Managed Care – PPO

## 2022-08-03 ENCOUNTER — Encounter: Payer: Self-pay | Admitting: *Deleted

## 2022-08-03 DIAGNOSIS — S6992XA Unspecified injury of left wrist, hand and finger(s), initial encounter: Secondary | ICD-10-CM

## 2022-08-03 DIAGNOSIS — S63005A Unspecified dislocation of left wrist and hand, initial encounter: Secondary | ICD-10-CM

## 2022-08-03 NOTE — ED Triage Notes (Signed)
Pt reports she was moving a chair today and heard her LT wrist pop . Pt has 7/10 .

## 2022-08-03 NOTE — Discharge Instructions (Addendum)
Your ulna bone (wrist bone) is dislocated.  Go to [facility], if they unable to immediately assist you they may refer to the Emergency department or have you return the following day.  You received Toradol 30 mg IM injection. Keep arm in sling, until seen by provider.

## 2022-08-03 NOTE — ED Provider Notes (Signed)
Holly Fisher    CSN: [id number] Arrival date & time: 08/03/22  1723      History   Chief Complaint Chief Complaint  Patient presents with  . Wrist Pain    HPI Holly Fisher is a 36 y.o. female.   HPI   Past Medical History:  Diagnosis Date  . Acute cholecystitis   . Psoriasis     There are no problems to display for this patient.   Past Surgical History:  Procedure Laterality Date  . CHOLECYSTECTOMY N/A 12/18/2018   Procedure: LAPAROSCOPIC CHOLECYSTECTOMY;  Surgeon: Carolan Shiver, MD;  Location: [facility] ORS;  Service: General;  Laterality: N/A;    OB History   No obstetric history on file.      Home Medications    Prior to Admission medications   Medication Sig Start Date End Date Taking? Authorizing Provider  Adalimumab 40 MG/0.4ML PNKT Inject 40 mg into the skin. Every other week 07/05/18   [provider]  fluconazole (DIFLUCAN) 150 MG tablet Take 1 tablet (150 mg) PO x 1 dose. May repeat 150 mg dose in 3 days if still symptomatic. 02/24/20   Verlee Monte, NP  levonorgestrel-ethinyl estradiol (JOLESSA) 0.15-0.03 MG tablet Take 1 tablet by mouth daily. 12/23/17   [provider]  Risankizumab-rzaa,150 MG Dose, (SKYRIZI, 150 MG DOSE,) 75 MG/0.83ML PSKT Inct150 mg into skin at week zero ,4 then every 12 weeks 11/29/19   [provider]    Family History Family History  Problem Relation Age of Onset  . Autoimmune disease Mother   . Hypertension Mother   . Healthy Father     Social History Social History   Tobacco Use  . Smoking status: Never  . Smokeless tobacco: Never  Vaping Use  . Vaping Use: Never used  Substance Use Topics  . Alcohol use: Not Currently  . Drug use: Not Currently     Allergies   Other   Review of Systems Review of Systems   Physical Exam Triage Vital Signs ED Triage Vitals [08/03/22 1750]  Enc Vitals Group     BP 109/76     Pulse Rate 83     Resp 18     Temp 98.9 F  (37.2 C)     Temp src      SpO2 98 %     Weight      Height      Head Circumference      Peak Flow      Pain Score      Pain Loc      Pain Edu?      Excl. in GC?    No data found.  Updated Vital Signs BP 109/76   Pulse 83   Temp 98.9 F (37.2 C)   Resp 18   SpO2 98%   Visual Acuity Right Eye Distance:   Left Eye Distance:   Bilateral Distance:    Right Eye Near:   Left Eye Near:    Bilateral Near:     Physical Exam   UC Treatments / Results  Labs (all labs ordered are listed, but only abnormal results are displayed) Labs Reviewed - No data to display  EKG   Radiology No results found.  Procedures Procedures (including critical care time)  Medications Ordered in UC Medications - No data to display  Initial Impression / Assessment and Plan / UC Course  I have reviewed the triage vital signs and the nursing notes.  Pertinent  labs & imaging results that were available during my care of the patient were reviewed by me and considered in my medical decision making (see chart for details).     *** Final Clinical Impressions(s) / UC Diagnoses   Final diagnoses:  Injury of left wrist, initial encounter   Discharge Instructions   None    ED Prescriptions   None    PDMP not reviewed this encounter.

## 2022-08-12 ENCOUNTER — Other Ambulatory Visit: Payer: Self-pay | Admitting: Orthopaedic Surgery

## 2022-08-12 DIAGNOSIS — M25531 Pain in right wrist: Secondary | ICD-10-CM

## 2022-08-12 DIAGNOSIS — M25532 Pain in left wrist: Secondary | ICD-10-CM

## 2022-08-13 ENCOUNTER — Ambulatory Visit: Payer: No Typology Code available for payment source

## 2022-08-13 ENCOUNTER — Ambulatory Visit
Admission: RE | Admit: 2022-08-13 | Discharge: 2022-08-13 | Disposition: A | Discharge status: Home / Self Care | Payer: No Typology Code available for payment source | Source: Ambulatory Visit | Attending: Orthopaedic Surgery | Admitting: Orthopaedic Surgery

## 2022-08-13 DIAGNOSIS — M25531 Pain in right wrist: Secondary | ICD-10-CM | POA: Diagnosis present

## 2022-08-13 DIAGNOSIS — M25532 Pain in left wrist: Secondary | ICD-10-CM

## 2023-11-17 ENCOUNTER — Encounter: Payer: Self-pay | Admitting: Obstetrics and Gynecology

## 2023-11-17 ENCOUNTER — Other Ambulatory Visit: Payer: Self-pay | Admitting: Obstetrics and Gynecology

## 2023-11-17 DIAGNOSIS — N644 Mastodynia: Secondary | ICD-10-CM

## 2024-04-20 ENCOUNTER — Ambulatory Visit
Admission: RE | Admit: 2024-04-20 | Discharge: 2024-04-20 | Disposition: A | Discharge status: Home / Self Care | Payer: Self-pay | Source: Ambulatory Visit | Attending: Obstetrics and Gynecology | Admitting: Obstetrics and Gynecology

## 2024-04-20 DIAGNOSIS — N644 Mastodynia: Secondary | ICD-10-CM

## 2024-10-24 ENCOUNTER — Ambulatory Visit: Admitting: Podiatry

## 2024-10-24 DIAGNOSIS — M722 Plantar fascial fibromatosis: Secondary | ICD-10-CM | POA: Diagnosis not present

## 2024-10-24 NOTE — Progress Notes (Signed)
 Subjective:  Patient ID: Holly Fisher, female    DOB: 01/26/1986,  MRN: [id number]  Chief Complaint  Patient presents with   Foot Pain    Pt stated that she was told she had plantar fascitis and gout at one point but she was not 100% sure she had gout she stated that she has pain on the top of her feet and the bottom     38 y.o. female presents with the above complaint.  Patient has history of bilateral plantar fasciitis.  She states been dealing with this for quite some time she was being treated for a little by Dr. Gabriel in the past.  She states is hurting and she has been trying to do it at home but is continually down where she wants to discuss treatment options for this pain scale 7 out of 10 dull aching nature she has had orthotics in the past she did not bring with her.   Review of Systems: Negative except as noted in the HPI. Denies N/V/F/Ch.  Past Medical History:  Diagnosis Date   Acute cholecystitis    Psoriasis     Current Outpatient Medications:    Adalimumab 40 MG/0.4ML PNKT, Inject 40 mg into the skin. Every other week, Disp: , Rfl:    fluconazole  (DIFLUCAN ) 150 MG tablet, Take 1 tablet (150 mg) PO x 1 dose. May repeat 150 mg dose in 3 days if still symptomatic., Disp: 2 tablet, Rfl: 0   levonorgestrel -ethinyl estradiol  (JOLESSA ) 0.15-0.03 MG tablet, Take 1 tablet by mouth daily., Disp: , Rfl:    Risankizumab-rzaa,150 MG Dose, (SKYRIZI, 150 MG DOSE,) 75 MG/0.83ML PSKT, Inct150 mg into skin at week zero ,4 then every 12 weeks, Disp: , Rfl:   Social History   Tobacco Use  Smoking Status Never  Smokeless Tobacco Never    Allergies  Allergen Reactions   Other Other (See Comments)    PT does not take the chalk like pill for strep throat.   Objective:  There were no vitals filed for this visit. There is no height or weight on file to calculate BMI. Constitutional Well developed. Well nourished.  Vascular Dorsalis pedis pulses palpable bilaterally. Posterior  tibial pulses palpable bilaterally. Capillary refill normal to all digits.  No cyanosis or clubbing noted. Pedal hair growth normal.  Neurologic Normal speech. Oriented to person, place, and time. Epicritic sensation to light touch grossly present bilaterally.  Dermatologic Nails well groomed and normal in appearance. No open wounds. No skin lesions.  Orthopedic: Normal joint ROM without pain or crepitus bilaterally. No visible deformities. Tender to palpation at the calcaneal tuber bilaterally. No pain with calcaneal squeeze bilaterally. Ankle ROM diminished range of motion bilaterally. Silfverskiold Test: positive bilaterally.   Radiographs: None  Assessment:   1. Plantar fasciitis of right foot   2. Plantar fasciitis of left foot    Plan:  Patient was evaluated and treated and all questions answered.  Plantar Fasciitis, bilaterally - XR reviewed as above.  - Educated on icing and stretching. Instructions given.  - Injection delivered to the plantar fascia as below. - DME: Plantar fascial brace dispensed to support the medial longitudinal arch of the foot and offload pressure from the heel and prevent arch collapse during weightbearing - Pharmacologic management: None - Patient has custom made orthotic she will bring with her next time  Procedure: Injection Tendon/Ligament Location: Bilateral plantar fascia at the glabrous junction; medial approach. Skin Prep: alcohol Injectate: 0.5 cc 0.5% marcaine  plain, 0.5 cc  of 1% Lidocaine , 0.5 cc kenalog 10. Disposition: Patient tolerated procedure well. Injection site dressed with a band-aid.  No follow-ups on file.

## 2024-11-23 ENCOUNTER — Ambulatory Visit: Admitting: Podiatry

## 2024-11-23 DIAGNOSIS — M62462 Contracture of muscle, left lower leg: Secondary | ICD-10-CM | POA: Diagnosis not present

## 2024-11-23 DIAGNOSIS — M722 Plantar fascial fibromatosis: Secondary | ICD-10-CM | POA: Diagnosis not present

## 2024-11-23 DIAGNOSIS — M62461 Contracture of muscle, right lower leg: Secondary | ICD-10-CM | POA: Diagnosis not present

## 2024-11-23 NOTE — Progress Notes (Signed)
  Subjective:  Patient ID: Holly Fisher, female    DOB: Feb 08, 1986,  MRN: [id number]  Chief Complaint  Patient presents with   Plantar Fasciitis    38 y.o. female presents with the above complaint.  Patient presents for follow-up of bilateral plantar fasciitis she states that she is doing a little bit better.  The injection helped.  She is here for another injection she still has some residual pain denies any other acute complaints.   Review of Systems: Negative except as noted in the HPI. Denies N/V/F/Ch.  Past Medical History:  Diagnosis Date   Acute cholecystitis    Psoriasis     Current Outpatient Medications:    Adalimumab 40 MG/0.4ML PNKT, Inject 40 mg into the skin. Every other week, Disp: , Rfl:    fluconazole  (DIFLUCAN ) 150 MG tablet, Take 1 tablet (150 mg) PO x 1 dose. May repeat 150 mg dose in 3 days if still symptomatic., Disp: 2 tablet, Rfl: 0   levonorgestrel -ethinyl estradiol  (JOLESSA ) 0.15-0.03 MG tablet, Take 1 tablet by mouth daily., Disp: , Rfl:    Risankizumab-rzaa,150 MG Dose, (SKYRIZI, 150 MG DOSE,) 75 MG/0.83ML PSKT, Inct150 mg into skin at week zero ,4 then every 12 weeks, Disp: , Rfl:   Social History   Tobacco Use  Smoking Status Never  Smokeless Tobacco Never    Allergies  Allergen Reactions   Other Other (See Comments)    PT does not take the chalk like pill for strep throat.   Objective:  There were no vitals filed for this visit. There is no height or weight on file to calculate BMI. Constitutional Well developed. Well nourished.  Vascular Dorsalis pedis pulses palpable bilaterally. Posterior tibial pulses palpable bilaterally. Capillary refill normal to all digits.  No cyanosis or clubbing noted. Pedal hair growth normal.  Neurologic Normal speech. Oriented to person, place, and time. Epicritic sensation to light touch grossly present bilaterally.  Dermatologic Nails well groomed and normal in appearance. No open wounds. No skin  lesions.  Orthopedic: Normal joint ROM without pain or crepitus bilaterally. No visible deformities. Tender to palpation at the calcaneal tuber bilaterally. No pain with calcaneal squeeze bilaterally. Ankle ROM diminished range of motion bilaterally. Silfverskiold Test: positive bilaterally.   Radiographs: None  Assessment:   1. Plantar fasciitis of right foot   2. Plantar fasciitis of left foot   3. Gastrocnemius equinus, bilateral     Plan:  Patient was evaluated and treated and all questions answered.  Plantar Fasciitis, bilaterally with gastrocnemius equinus - XR reviewed as above.  - Educated on icing and stretching. Instructions given.  - Second injection delivered to the plantar fascia as below. - DME: Plantar fascial brace dispensed to support the medial longitudinal arch of the foot and offload pressure from the heel and prevent arch collapse during weightbearing - Pharmacologic management: None - Patient has custom made orthotic she will bring with her next time  Procedure: Injection Tendon/Ligament Location: Bilateral plantar fascia at the glabrous junction; medial approach. Skin Prep: alcohol Injectate: 0.5 cc 0.5% marcaine  plain, 0.5 cc of 1% Lidocaine , 0.5 cc kenalog 10. Disposition: Patient tolerated procedure well. Injection site dressed with a band-aid.  No follow-ups on file.

## 2024-12-19 ENCOUNTER — Ambulatory Visit: Admitting: Podiatry

## 2024-12-19 ENCOUNTER — Encounter: Payer: Self-pay | Admitting: Nurse Practitioner

## 2024-12-19 ENCOUNTER — Ambulatory Visit: Admitting: Nurse Practitioner

## 2024-12-19 VITALS — BP 120/70 | HR 81 | Temp 99.0°F | Ht 60.0 in | Wt 193.6 lb

## 2024-12-19 DIAGNOSIS — R609 Edema, unspecified: Secondary | ICD-10-CM | POA: Diagnosis not present

## 2024-12-19 DIAGNOSIS — R110 Nausea: Secondary | ICD-10-CM | POA: Diagnosis not present

## 2024-12-19 DIAGNOSIS — Z6837 Body mass index (BMI) 37.0-37.9, adult: Secondary | ICD-10-CM

## 2024-12-19 DIAGNOSIS — E66812 Obesity, class 2: Secondary | ICD-10-CM | POA: Diagnosis not present

## 2024-12-19 DIAGNOSIS — Z832 Family history of diseases of the blood and blood-forming organs and certain disorders involving the immune mechanism: Secondary | ICD-10-CM | POA: Diagnosis not present

## 2024-12-19 DIAGNOSIS — E6609 Other obesity due to excess calories: Secondary | ICD-10-CM | POA: Diagnosis not present

## 2024-12-19 DIAGNOSIS — Z2821 Immunization not carried out because of patient refusal: Secondary | ICD-10-CM

## 2024-12-19 DIAGNOSIS — Z7689 Persons encountering health services in other specified circumstances: Secondary | ICD-10-CM

## 2024-12-19 DIAGNOSIS — N898 Other specified noninflammatory disorders of vagina: Secondary | ICD-10-CM | POA: Diagnosis not present

## 2024-12-19 DIAGNOSIS — L409 Psoriasis, unspecified: Secondary | ICD-10-CM | POA: Diagnosis not present

## 2024-12-19 MED ORDER — FLUCONAZOLE 100 MG PO TABS: 100.0000 mg | ORAL_TABLET | Freq: Every day | ORAL | 0 refills | Status: AC

## 2024-12-19 MED ORDER — ONDANSETRON HCL 4 MG PO TABS: 4.0000 mg | ORAL_TABLET | Freq: Every day | ORAL | 1 refills | Status: AC | PRN

## 2024-12-19 NOTE — Progress Notes (Signed)
 LILLETTE Kristeen JINNY Gladis, CMA,acting as a neurosurgeon for Holly Ada, FNP.,have documented all relevant documentation on the behalf of Holly Ada, FNP,as directed by  Holly Ada, FNP while in the presence of Holly Ada, FNP.  Subjective:  Patient ID: Alberta LITTIE Brandt , female    DOB: [birth year] , 38 y.o.   MRN: [id number]  Chief Complaint  Patient presents with   Establish Care    Patient presents today to establish care, Patient reports compliance with medication. Patient denies any chest pain, SOB, or headaches.    Edema    Patient reports she had the flu about 2 weeks ago and since she has had swelling in face, hands, and legs. She reports she is still nausea and fatigue from the flu she did take Oseltamivir phosphate 75mg .    Vaginitis    Patient reports she has discharge, soreness, itching. Since having the flu as well. She reports using OTC medications with no relief.     She was seeing Dr Rosaline Monte in [place], last seen before Thanksgiving. 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 Single. No children. She is currently seeing a Dermatologist for psoriasis - nail, face, leg, hands and vaginal area. She has had her gallbladder removed - 2019. No issues.   She noticed swelling to her face, hands and feet since around the time she had the flu. She has noticed she is breathing harder since having the flu. She was given tamiflu and inhaler. Denies an increase in salt intake.   She has a vaginal discharge which she thinks may be a yeast infection. She has used an over the counter antifungal which is better now. LMP before thanksgiving - has every 3 months on Julisa OCP  Vaginal Discharge The patient's primary symptoms include genital itching and vaginal discharge. The patient's pertinent negatives include no genital rash. The problem affects both sides. She is not pregnant. Pertinent negatives include no constipation or headaches. There has been no  bleeding. She has not been passing tissue. Nothing aggravates the symptoms. She has tried antifungals for the symptoms. She is sexually active. No, her partner does not have an STD. She uses oral contraceptives for contraception. Her menstrual history has been irregular (due to birth control).    Discussed the use of AI scribe software for clinical note transcription with the patient, who gave verbal consent to proceed.  History of Present Illness Manette L Helinski is a 38 year old female with anemia and psoriasis who presents with swelling in her feet, hands, and face.  She experiences swelling in her feet, hands, and face, most noticeable in the morning and after sitting for about twenty minutes. The facial swelling is significant enough that she feels like she is 'looking at my eyelids.' She also has aching in her feet and difficulty fitting into shoes and gloves. The swelling began after a bout of the flu, and she has been consuming a gallon of water daily. No recent weight changes, and she is cautious about salt intake.  She reports shortness of breath and uses an inhaler provided during a clinic visit in [place] on December 15th, where she was given Yevonne and an inhaler. No recent use of steroids.  She experiences vaginal discharge, soreness, and itching. The discharge is clear white and minimal. She has a history of sensitivity to medications, which she believes may have contributed to the yeast infection. Over-the-counter treatment has improved symptoms but not completely resolved them.  She has a history of anemia, which worsens when her weight is between 125 to 140 pounds, though she is currently above that range. She also has psoriasis, diagnosed about ten years ago, affecting her knees, face, legs, private areas, and hands. She is currently using Skyrizi for psoriasis management.  Past Medical History:  Diagnosis Date   Acute cholecystitis    Anemia    Psoriasis      Family  History  Problem Relation Age of Onset   Autoimmune disease Mother    Hypertension Mother    Miscarriages / Stillbirths Mother    Healthy Father    Cancer Paternal Grandmother     Current Medications[1]   Allergies[2]   Review of Systems  Constitutional: Negative.  Negative for activity change and fatigue.  Eyes:  Negative for visual disturbance.  Respiratory: Negative.  Negative for choking, shortness of breath and wheezing.   Cardiovascular: Negative.  Negative for chest pain, palpitations and leg swelling.  Gastrointestinal: Negative.  Negative for constipation.  Endocrine: Negative.  Negative for polydipsia, polyphagia and polyuria.  Genitourinary:  Positive for vaginal discharge.  Musculoskeletal: Negative.   Skin: Negative.   Neurological:  Negative for dizziness, weakness and headaches.  Psychiatric/Behavioral:  Negative for confusion. The patient is not nervous/anxious.      Today's Vitals   12/19/24 1407  BP: 120/70  Pulse: 81  Temp: 99 F (37.2 C)  TempSrc: Oral  Weight: 193 lb 9.6 oz (87.8 kg)  Height: 5' (1.524 m)  PainSc: 0-No pain   Body mass index is 37.81 kg/m.  Wt Readings from Last 3 Encounters:  12/19/24 193 lb 9.6 oz (87.8 kg)  02/24/20 160 lb (72.6 kg)  12/17/18 170 lb (77.1 kg)    Objective:  Physical Exam Vitals and nursing note reviewed.  Constitutional:      General: She is not in acute distress.    Appearance: Normal appearance. She is well-developed.  HENT:     Head: Normocephalic and atraumatic.  Eyes:     Pupils: Pupils are equal, round, and reactive to light.  Cardiovascular:     Rate and Rhythm: Normal rate and regular rhythm.     Pulses: Normal pulses.     Heart sounds: Normal heart sounds. No murmur heard. Pulmonary:     Effort: Pulmonary effort is normal. No respiratory distress.     Breath sounds: Normal breath sounds. No wheezing.  Musculoskeletal:        General: Normal range of motion.  Skin:    General: Skin is  warm and dry.     Capillary Refill: Capillary refill takes less than 2 seconds.  Neurological:     General: No focal deficit present.     Mental Status: She is alert and oriented to person, place, and time.     Cranial Nerves: No cranial nerve deficit.  Psychiatric:        Mood and Affect: Mood normal.      Assessment And Plan:   Assessment & Plan Establishing care with new doctor, encounter for  Vaginal itching Vaginal swelling and discharge likely due to yeast infection. Symptoms partially improved with OTC treatment. - Prescribed Diflucan , repeat in five days if needed. Influenza vaccination declined  Tetanus, diphtheria, and acellular pertussis (Tdap) vaccination declined  Class 2 obesity due to excess calories with body mass index (BMI) of 37.0 to 37.9 in adult, unspecified whether serious comorbidity present  Swelling Intermittent swelling in extremities and face with shortness of breath. Differential  includes systemic and cardiac causes. - Ordered labs: WBC count, BNP, kidney and liver function tests, autoimmune panel. - Evaluate lab results for systemic or cardiac issues. Family history of autoimmune disorder  Nausea Persistent nausea post-flu, possibly post-viral. Dietary modifications considered. - Ordered pregnancy test. negative - Prescribed Zofran  as needed every eight hours. - Advised bland diet, avoid dairy, greasy, fried foods. - Recommended ginger chews for nausea. Psoriasis Chronic psoriasis managed with Skyrizi. Lesions present on multiple areas.  Orders Placed This Encounter  Procedures   NuSwab Vaginitis Plus (VG+)   Autoimmune Profile   Brain natriuretic peptide   CMP14+EGFR   CBC   POCT Urine Pregnancy     Return in about 5 months (around 05/19/2025) for phy when able.  Patient was given opportunity to ask questions. Patient verbalized understanding of the plan and was able to repeat key elements of the plan. All questions were answered to  their satisfaction.    LILLETTE Holly Ada, FNP, have reviewed all documentation for this visit. The documentation on 12/19/2024 for the exam, diagnosis, procedures, and orders are all accurate and complete.   IF YOU HAVE BEEN REFERRED TO A SPECIALIST, IT MAY TAKE 1-2 WEEKS TO SCHEDULE/PROCESS THE REFERRAL. IF YOU HAVE NOT HEARD FROM US /SPECIALIST IN TWO WEEKS, PLEASE GIVE US  A CALL AT [phone] X [phone].      [1]  Current Outpatient Medications:    fluconazole  (DIFLUCAN ) 100 MG tablet, Take 1 tablet (100 mg total) by mouth daily. Take 1 tablet by mouth now repeat in 5 days, Disp: 2 tablet, Rfl: 0   levonorgestrel -ethinyl estradiol  (JOLESSA ) 0.15-0.03 MG tablet, Take 1 tablet by mouth daily., Disp: , Rfl:    ondansetron  (ZOFRAN ) 4 MG tablet, Take 1 tablet (4 mg total) by mouth daily as needed for nausea or vomiting., Disp: 30 tablet, Rfl: 1   Adalimumab 40 MG/0.4ML PNKT, Inject 40 mg into the skin. Every other week (Patient not taking: Reported on 12/19/2024), Disp: , Rfl:    metronidazole  (FLAGYL ) 375 MG capsule, Take 1 capsule (375 mg total) by mouth 2 (two) times daily., Disp: 20 capsule, Rfl: 0   Risankizumab-rzaa,150 MG Dose, (SKYRIZI, 150 MG DOSE,) 75 MG/0.83ML PSKT, Inct150 mg into skin at week zero ,4 then every 12 weeks (Patient not taking: Reported on 12/19/2024), Disp: , Rfl:  [2]  Allergies Allergen Reactions   Other Other (See Comments)    PT does not take the chalk like pill for strep throat.

## 2024-12-20 LAB — CMP14+EGFR
ALT: 28 IU/L (ref 0–32)
AST: 21 IU/L (ref 0–40)
Albumin: 4.2 g/dL (ref 3.9–4.9)
Alkaline Phosphatase: 76 IU/L (ref 41–116)
BUN/Creatinine Ratio: 12 (ref 9–23)
BUN: 11 mg/dL (ref 6–20)
Bilirubin Total: 0.2 mg/dL (ref 0.0–1.2)
CO2: 21 mmol/L (ref 20–29)
Calcium: 9.1 mg/dL (ref 8.7–10.2)
Chloride: 100 mmol/L (ref 96–106)
Creatinine, Ser: 0.92 mg/dL (ref 0.57–1.00)
Globulin, Total: 2.6 g/dL (ref 1.5–4.5)
Glucose: 107 mg/dL — ABNORMAL HIGH (ref 70–99)
Potassium: 4.4 mmol/L (ref 3.5–5.2)
Sodium: 139 mmol/L (ref 134–144)
Total Protein: 6.8 g/dL (ref 6.0–8.5)
eGFR: 83 mL/min/1.73

## 2024-12-20 LAB — CBC
Hematocrit: 37.4 % (ref 34.0–46.6)
Hemoglobin: 12.5 g/dL (ref 11.1–15.9)
MCH: 28.9 pg (ref 26.6–33.0)
MCHC: 33.4 g/dL (ref 31.5–35.7)
MCV: 87 fL (ref 79–97)
Platelets: 385 x10E3/uL (ref 150–450)
RBC: 4.32 x10E6/uL (ref 3.77–5.28)
RDW: 14.6 % (ref 11.7–15.4)
WBC: 12.4 x10E3/uL — ABNORMAL HIGH (ref 3.4–10.8)

## 2024-12-20 LAB — BRAIN NATRIURETIC PEPTIDE: BNP: 11.5 pg/mL (ref 0.0–100.0)

## 2024-12-20 LAB — AUTOIMMUNE PROFILE
Anti Nuclear Antibody (ANA): NEGATIVE
Complement C3, Serum: 168 mg/dL — ABNORMAL HIGH (ref 82–167)
dsDNA Ab: 1 [IU]/mL (ref 0–9)

## 2024-12-21 LAB — NUSWAB VAGINITIS PLUS (VG+)
Atopobium vaginae: HIGH {score} — AB
Candida albicans, NAA: NEGATIVE
Candida glabrata, NAA: NEGATIVE
Chlamydia trachomatis, NAA: NEGATIVE
Megasphaera 1: HIGH {score} — AB
Neisseria gonorrhoeae, NAA: NEGATIVE
Trich vag by NAA: NEGATIVE

## 2024-12-26 ENCOUNTER — Ambulatory Visit: Payer: Self-pay | Admitting: Nurse Practitioner

## 2024-12-29 ENCOUNTER — Other Ambulatory Visit: Payer: Self-pay

## 2024-12-29 MED ORDER — METRONIDAZOLE 375 MG PO CAPS: 375.0000 mg | ORAL_CAPSULE | Freq: Two times a day (BID) | ORAL | 0 refills | Status: DC

## 2024-12-31 LAB — POCT URINE PREGNANCY: Preg Test, Ur: NEGATIVE

## 2025-01-03 ENCOUNTER — Other Ambulatory Visit: Payer: Self-pay | Admitting: Nurse Practitioner

## 2025-01-03 MED ORDER — METRONIDAZOLE 500 MG PO TABS: 500.0000 mg | ORAL_TABLET | Freq: Three times a day (TID) | ORAL | 0 refills | Status: DC

## 2025-01-04 ENCOUNTER — Other Ambulatory Visit: Payer: Self-pay

## 2025-01-04 MED ORDER — METRONIDAZOLE 500 MG PO TABS: 500.0000 mg | ORAL_TABLET | Freq: Three times a day (TID) | ORAL | 0 refills | Status: AC

## 2025-06-04 ENCOUNTER — Encounter: Payer: Self-pay | Admitting: Nurse Practitioner
